# Patient Record
Sex: Male | Born: 1955 | Race: White | Hispanic: No | Marital: Married | Smoking: Never smoker
Health system: Southern US, Community
[De-identification: ages and names within clinical notes are randomized; demographics above are authoritative.]

## PROBLEM LIST (undated history)

## (undated) DIAGNOSIS — Z86018 Personal history of other benign neoplasm: Secondary | ICD-10-CM

## (undated) DIAGNOSIS — Z8371 Family history of colonic polyps: Secondary | ICD-10-CM

## (undated) DIAGNOSIS — M1990 Unspecified osteoarthritis, unspecified site: Secondary | ICD-10-CM

## (undated) DIAGNOSIS — Z83719 Family history of colon polyps, unspecified: Secondary | ICD-10-CM

## (undated) DIAGNOSIS — N419 Inflammatory disease of prostate, unspecified: Secondary | ICD-10-CM

## (undated) DIAGNOSIS — M810 Age-related osteoporosis without current pathological fracture: Secondary | ICD-10-CM

## (undated) DIAGNOSIS — I10 Essential (primary) hypertension: Secondary | ICD-10-CM

## (undated) HISTORY — DX: Age-related osteoporosis without current pathological fracture: M81.0

## (undated) HISTORY — DX: Unspecified osteoarthritis, unspecified site: M19.90

## (undated) HISTORY — DX: Family history of colonic polyps: Z83.71

## (undated) HISTORY — DX: Essential (primary) hypertension: I10

## (undated) HISTORY — DX: Family history of colon polyps, unspecified: Z83.719

---
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
## 2004-09-21 DIAGNOSIS — Z85828 Personal history of other malignant neoplasm of skin: Secondary | ICD-10-CM

## 2004-09-21 HISTORY — DX: Personal history of other malignant neoplasm of skin: Z85.828

## 2005-07-28 ENCOUNTER — Ambulatory Visit: Payer: Self-pay | Admitting: General Surgery

## 2006-09-20 DIAGNOSIS — I10 Essential (primary) hypertension: Secondary | ICD-10-CM

## 2006-09-20 HISTORY — DX: Essential (primary) hypertension: I10

## 2007-09-06 DIAGNOSIS — Z86018 Personal history of other benign neoplasm: Secondary | ICD-10-CM

## 2007-09-06 HISTORY — DX: Personal history of other benign neoplasm: Z86.018

## 2008-06-09 ENCOUNTER — Emergency Department: Payer: Self-pay | Admitting: Emergency Medicine

## 2009-09-20 HISTORY — PX: SKIN LESION EXCISION: SHX2412

## 2010-09-10 DIAGNOSIS — Z87898 Personal history of other specified conditions: Secondary | ICD-10-CM

## 2010-09-10 HISTORY — DX: Personal history of other specified conditions: Z87.898

## 2010-09-11 ENCOUNTER — Ambulatory Visit: Payer: Self-pay | Admitting: General Surgery

## 2010-09-11 HISTORY — PX: COLONOSCOPY: SHX174

## 2013-03-30 ENCOUNTER — Encounter: Payer: Self-pay | Admitting: *Deleted

## 2013-08-23 ENCOUNTER — Emergency Department: Payer: Self-pay | Admitting: Internal Medicine

## 2013-08-26 ENCOUNTER — Emergency Department: Payer: Self-pay | Admitting: Emergency Medicine

## 2013-08-30 ENCOUNTER — Emergency Department: Payer: Self-pay | Admitting: Emergency Medicine

## 2013-09-06 ENCOUNTER — Emergency Department: Payer: Self-pay | Admitting: Emergency Medicine

## 2013-09-06 LAB — BASIC METABOLIC PANEL WITH GFR
Anion Gap: 7
BUN: 16 mg/dL
Calcium, Total: 9 mg/dL
Chloride: 104 mmol/L
Co2: 28 mmol/L
Creatinine: 0.95 mg/dL
EGFR (African American): >60
EGFR (Non-African Amer.): >60
Glucose: 95 mg/dL
Osmolality: 279
Potassium: 3.2 mmol/L — ABNORMAL LOW
Sodium: 139 mmol/L

## 2013-09-06 LAB — CBC
MCHC: 33 g/dL (ref 32.0–36.0)
MCV: 100 fL (ref 80–100)
Platelet: 201 10*3/uL (ref 150–440)
RBC: 4.42 10*6/uL (ref 4.40–5.90)
RDW: 13.6 % (ref 11.5–14.5)
WBC: 8.1 10*3/uL (ref 3.8–10.6)

## 2013-09-07 ENCOUNTER — Observation Stay: Payer: Self-pay | Admitting: Internal Medicine

## 2013-09-07 LAB — CK TOTAL AND CKMB (NOT AT ARMC)
CK, Total: 68 U/L (ref 35–232)
CK, Total: 54 U/L (ref 35–232)
CK-MB: 1.3 ng/mL (ref 0.5–3.6)

## 2013-09-07 LAB — TROPONIN I: Troponin-I: 0.04 ng/mL

## 2013-09-08 DIAGNOSIS — R079 Chest pain, unspecified: Secondary | ICD-10-CM

## 2014-02-19 DIAGNOSIS — R0789 Other chest pain: Secondary | ICD-10-CM | POA: Insufficient documentation

## 2014-08-21 DIAGNOSIS — I10 Essential (primary) hypertension: Secondary | ICD-10-CM | POA: Insufficient documentation

## 2014-08-21 DIAGNOSIS — R011 Cardiac murmur, unspecified: Secondary | ICD-10-CM | POA: Insufficient documentation

## 2015-01-10 NOTE — Discharge Summary (Signed)
PATIENT NAME:  Marcus Malone, Marcus Malone MR#:  [id number] DATE OF BIRTH:  1956-03-08  DATE OF ADMISSION:  09/07/2013 DATE OF DISCHARGE:  09/07/2013  PRIMARY CARE PHYSICIAN:  Dr. Benita Stabile.  FINAL DIAGNOSES: 1.  Chest pain, not cardiac, could be musculoskeletal pain.  2.  Hypertension.  3.  Rheumatoid arthritis.  4.  Hypokalemia.   MEDICATIONS ON DISCHARGE:  Include amitriptyline 50 mg at bedtime, folic acid 1 mg 2 tablets daily, methotrexate 2.5 mg 10 orally per week, fish oil 1000 mg 2 capsules twice a day, prostate SR 320 mg with sitosterols oral capsule 2 capsules once a day, Centrum Silver 1 tablet daily, vitamin C 500 mg 2 tablets twice a day, red yeast rice 600 mg 2 capsules daily, diltiazem ER 180 mg 2 capsules daily, famciclovir 500 mg 1 tablet daily, aspirin 325 mg daily, prednisone 5 mg 5 tablets day one, 4 tablets day two, and 3 tablets day four and five, 2 tablets day six and seven, 1 tablet day eight and nine.    DIET:  Low sodium diet, regular consistency.   ACTIVITY:  As tolerated.   FOLLOW-UP:  With your rheumatologist, 1 to 2 weeks with Dr. Benita Stabile.   HOSPITAL COURSE:  The patient was admitted and discharged same day 09/07/2013.  Came in with chest pain for the past week.  Laboratory and radiological data during the hospital course included white blood cell count 8.1, hemoglobin and hematocrit 14.5 and 44.0, platelet count of 201, glucose 95, BUN 16, creatinine 0.95, sodium 139, potassium 3.2, chloride 104, CO2 of 28.  Chest x-ray negative.  Cardiac enzymes were negative x 2 more times.  Repeat potassium 3.6.  Stress test negative.  The patient was seen after the stress test, describes the pain happening while he is at his desk or watching TV, does not happen when he runs.  No relation to food.  He recently stopped his meloxicam and went up on his methotrexate for rheumatoid arthritis.  Slight pain to palpation over the left parasternal area.    HOSPITAL COURSE PER PROBLEM LIST:   1.  For the patient's chest pain, this is noncardiac.  Cardiac enzymes were negative.  Stress test was negative.  I believe this is musculoskeletal in nature with his rheumatoid arthritis.  I did give him a stat dose of prednisone and a prednisone taper and asked him to follow up with his rheumatologist.  The patient also was very anxious.  Anxiety could be playing a role here.  2.  Hypertension.  Blood pressure borderline with mood.  Continue his diltiazem.  3.  Rheumatoid arthritis.  I did give him a prednisone taper.  Continue his methotrexate.  4.  Hypokalemia.  This had been replaced during the hospital stay.  No replacement necessary upon discharge.   TIME SPENT ON DISCHARGE:  40 minutes.    ____________________________ Tana Conch. Leslye Peer, MD rjw:ea D: 09/07/2013 17:28:24 ET T: 09/08/2013 03:22:41 ET JOB#: [id number]  cc: Tana Conch. Leslye Peer, MD, <Dictator> Leona Carry. Hall Busing, MD Marisue Brooklyn MD ELECTRONICALLY SIGNED 09/17/2013 15:40

## 2015-01-10 NOTE — H&P (Signed)
PATIENT NAME:  Marcus Malone, Marcus Malone MR#:  [id number] DATE OF BIRTH:  [birth year]  DATE OF ADMISSION:  09/06/2013  CHIEF COMPLAINT: Chest pain.  HISTORY OF PRESENT ILLNESS:  A 59 year old Caucasian gentleman with past medical history of rheumatoid arthritis, gastroesophageal reflux disease and hypertension presenting with chest pain. Described his chest pain over his left chest lasting one week in total which is intermittent, though gradual onset, lasting approximately one hour each episode. He has had one episode daily for one week with no association with exertion, chest pain described as pressure like 6 to 7 out of 10 in intensity, nonradiating. No worsening or relieving factors. No association with exertion. He is actually rather healthy, running approximately 4 miles daily without any problems. His chest pain occurs only while he is at rest. He has seen cardiology previously for what sounds like a vasovagal episode and has had a normal stress test about two years ago.  Currently, he is without complaints.    REVIEW OF SYSTEMS:  CONSTITUTIONAL: Denies fever, fatigue, weakness.  EYES: Denies blurred, double vision, eye pain.  EARS, NOSE, THROAT: Denies tinnitus, ear pain, hearing loss.  RESPIRATORY: Denies cough, wheeze, shortness of breath.  CARDIOVASCULAR: Positive for chest pain. Denies any orthopnea, edema, palpitations.  GASTROINTESTINAL: Denies nausea, vomiting, diarrhea.  GENITOURINARY: Denies dysuria, hematuria.  ENDOCRINE: Denies nocturia or thyroid problems.  HEMATOLOGIC/LYMPHATIC: Denies easy bruising or bleeding.  SKIN: Denies rash or lesions.  MUSCULOSKELETAL: Positive for joint pain which is chronic. Denies any current neck, back, shoulder, knee or hip pain.  NEUROLOGIC: Denies paralysis, paresthesias.  PSYCHIATRIC: Denies anxiety or depressive symptoms.  Otherwise, full review of systems performed by me is negative.   PAST MEDICAL HISTORY: Rheumatoid arthritis, hypertension,  gastroesophageal reflux disease.   SOCIAL HISTORY: Denies any tobacco use. Occasional alcohol use; one beer weekly. Denies any drug usage. He is a professor at [employer].   FAMILY HISTORY: Positive for premature coronary disease in his father, having his first heart attack in his early 61s.   ALLERGIES:  NEOSPORIN AND SEAFOOD.   HOME MEDICATIONS: Include:  1.  Amitriptyline 50 mg p.o. at bedtime. 2.  Aspirin 325 mg p.o. daily. 3.  Centrum Silver multivitamin 1 tab p.o. daily. 4.  Diltiazem extended release 180 mg 2 capsules daily.  5.  Acyclovir 500 mg p.o. daily.  8.  Fish oil 1000 mg capsules 2 capsules b.i.d.  9.  Folic acid 1 mg 2 tablets daily. 9.  Meloxicam 15 mg by mouth as needed for pain. 10.  Methotrexate 2.5 mg 10 tablets once weekly. 11.  Prostate SR 320 mg 2 capsules p.o. daily. 12.  Red yeast rice 600 mg 2 capsules p.o. daily. 13.  Vitamin C 500 mg 2 tablets p.o. b.i.d.   PHYSICAL EXAMINATION: VITAL SIGNS: Temperature 97.8, heart rate 79, respirations 16, blood pressure 161/92, saturating 99% on room air. Weight 64.9 kg, BMI 18.9.  GENERAL: :Well-developed Caucasian gentleman, who is currently in no distress.  HEAD: Normocephalic, atraumatic.  EYES: Pupils equal, round, reactive to light. Extraocular muscles intact. No scleral icterus.  MOUTH: Moist mucous membranes. Dentition intact. No abscess noted.  EARS, NOSE AND THROAT: Throat clear without exudates. No external lesions.  NECK: Supple. No thyromegaly. No nodules. No JVD.  PULMONARY: Clear to auscultation throughout. No wheezes or rhonchi. No use of accessory muscles. Good respiratory effort.  CHEST: Nontender to palpation.  CARDIOVASCULAR: S1, S2, regular rate and rhythm. No murmurs, rubs or gallops. No edema. Pedal pulses 2+ bilaterally.  GASTROINTESTINAL: Soft, nontender, nondistended. No masses. Positive bowel sounds. No hepatosplenomegaly.   MUSCULOSKELETAL: No swelling, clubbing or edema. Range of motion full in  all extremities.  NEUROLOGIC: Cranial nerves II through XII intact. No gross motor deficits. Sensation intact. Reflexes intact.  SKIN: No ulcerations, lesions, rash or cyanosis. Skin warm, dry. Turgor is intact. PSYCHIATRIC: Mood and affect within normal limits. The patient is awake, alert and oriented x 3. Insight and judgment intact.   LABORATORY DATA: Sodium 139, potassium 3.2, chloride 104, bicarb 28, BUN 16, creatinine 0.95 glucose 95. Troponin I 0.03. WBC 8.1, hemoglobin 14.5, platelets 201. EKG performed revealing normal sinus rhythm without ST or T wave abnormalities; no EKG to compare to.   ASSESSMENT AND PLAN: A 59 year old Caucasian gentleman with history of rheumatoid arthritis and gastroesophageal reflux disease as well as hypertension presenting with chest pain.  1.  Atypical chest pain. As far as risk factors, does have hypertension, rheumatoid arthritis and family history. We will admit to observation with cardiac telemetry. Consult cardiology as he is well known to Dr. Ubaldo Glassing. He has been given aspirin thus far.   2.  Hypertension. Continue with Cardizem. 3.  Rheumatoid arthritis. Continue weekly methotrexate.  4.  Hypokalemia. Replace to goal of potassium 4 to 5.  5.  Venous thrombosis prophylaxis with heparin subcutaneous.   CODE STATUS: The patient is full code.   TIME SPENT: 45 minutes.     ____________________________ Aaron Mose. Hower, MD dkh:NTS D: 09/07/2013 01:18:54 ET T: 09/07/2013 01:36:05 ET JOB#: [id number]  cc: Aaron Mose. Hower, MD, <Dictator> DAVID Woodfin Ganja MD ELECTRONICALLY SIGNED 09/08/2013 2:45

## 2015-06-25 ENCOUNTER — Ambulatory Visit: Payer: Self-pay | Admitting: General Surgery

## 2015-07-21 ENCOUNTER — Ambulatory Visit: Payer: Self-pay | Admitting: General Surgery

## 2015-10-13 ENCOUNTER — Encounter: Payer: Self-pay | Admitting: General Surgery

## 2015-10-22 ENCOUNTER — Encounter: Payer: Self-pay | Admitting: *Deleted

## 2016-01-08 ENCOUNTER — Encounter: Payer: Self-pay | Admitting: General Surgery

## 2016-01-12 ENCOUNTER — Encounter: Payer: Self-pay | Admitting: General Surgery

## 2016-01-12 ENCOUNTER — Ambulatory Visit (INDEPENDENT_AMBULATORY_CARE_PROVIDER_SITE_OTHER): Payer: BLUE CROSS/BLUE SHIELD | Admitting: General Surgery

## 2016-01-12 VITALS — BP 128/68 | HR 70 | Resp 12 | Ht 73.0 in | Wt 159.0 lb

## 2016-01-12 DIAGNOSIS — Z800 Family history of malignant neoplasm of digestive organs: Secondary | ICD-10-CM

## 2016-01-12 MED ORDER — POLYETHYLENE GLYCOL 3350 17 GM/SCOOP PO POWD: ORAL | Status: DC

## 2016-01-12 NOTE — H&P (Signed)
HPI Marcus Malone is a 60 y.o. male Here today for a evaluation of a colonoscopy. Last colonoscopy 09/11/2010. No GI problems at this time. Bowel movements can go every 2-3 days.  His last colonoscopy in 2011 was compromised by poor prep in spite of his making use of the entire 2 L of MiraLAX solution.  I personally reviewed the patient's history.  HPI  Past Medical History  Diagnosis Date  . Hypertension 2008  . Family history of colonic polyps   . Arthritis     Rheumatiod  . Osteoporosis     Past Surgical History  Procedure Laterality Date  . Skin lesion excision  2011  . Colonoscopy  09/11/2010    Family History  Problem Relation Age of Onset  . Colon polyps Mother   . Colon polyps Maternal Uncle   . Heart disease Father   . Heart disease Mother     Social History Social History  Substance Use Topics  . Smoking status: Never Smoker   . Smokeless tobacco: Never Used  . Alcohol Use: Yes    Allergies  Allergen Reactions  . Shellfish Allergy     Stomach pain   . Neosporin [Neomycin-Bacitracin Zn-Polymyx] Rash    Current Outpatient Prescriptions  Medication Sig Dispense Refill  . acyclovir (ZOVIRAX) 200 MG capsule Take 200 mg by mouth as needed.    Marland Kitchen amitriptyline (ELAVIL) 50 MG tablet Take 50 mg by mouth 3 (three) times a week.  0  . Ascorbic Acid (VITAMIN Malone) 1000 MG tablet Take 1,000 mg by mouth daily.    Marland Kitchen aspirin 325 MG tablet Take 325 mg by mouth daily.    . Cholecalciferol (VITAMIN D) 2000 units CAPS Take by mouth.    . diltiazem (CARDIZEM CD) 180 MG 24 hr capsule Take 360 mg by mouth daily.  0  . folic acid (FOLVITE) 1 MG tablet Take 2 mg by mouth daily.  0  . meloxicam (MOBIC) 15 MG tablet Take 15 mg by mouth daily.  0  . methotrexate (RHEUMATREX) 2.5 MG tablet   0  . Multiple Vitamins-Minerals (CENTRUM SILVER PO) Take 1 tablet by mouth daily.    . Omega-3 Fatty Acids (FISH OIL PO) Take by mouth.    . potassium gluconate 595 (99 K) MG TABS tablet  Take 595 mg by mouth.    . Red Yeast Rice Extract (RED YEAST RICE PO) Take by mouth.    Marcus Malone (PROSTATE SR PO) Take by mouth.    . triamcinolone cream (KENALOG) 0.1 % Apply 1 application topically 2 (two) times daily.    . polyethylene glycol powder (GLYCOLAX/MIRALAX) powder 255 grams one bottle for colonoscopy prep 255 g 0   No current facility-administered medications for this visit.    Review of Systems Review of Systems  Constitutional: Negative.   Respiratory: Negative.   Cardiovascular: Negative.   Gastrointestinal: Negative.     Blood pressure 128/68, pulse 70, resp. rate 12, height 6\' 1"  (1.854 m), weight 159 lb (72.122 kg).  Physical Exam Physical Exam  Constitutional: He is oriented to person, place, and time. He appears well-developed and well-nourished.  Eyes: Conjunctivae are normal. No scleral icterus.  Neck: Neck supple.  Cardiovascular: Normal rate and regular rhythm.   Murmur heard.  Systolic murmur is present with a grade of 2/6  Pulmonary/Chest: Effort normal and breath sounds normal.  Lymphadenopathy:    He has no cervical adenopathy.  Neurological: He is alert and oriented to person,  place, and time.  Skin: Skin is warm and dry.    Data Reviewed 09/11/2010 colonoscopy reviewed. 2 day prep recommended.  Assessment    Candidate for follow-up colonoscopy.    Plan        Colonoscopy with possible biopsy/polypectomy prn: Information regarding the procedure, including its potential risks and complications (including but not limited to perforation of the bowel, which may require emergency surgery to repair, and bleeding) was verbally given to the patient. Educational information regarding lower intestinal endoscopy was given to the patient. Written instructions for how to complete the bowel prep using Miralax were provided. The importance of drinking ample fluids to avoid dehydration as a result of the prep emphasized.   This  patient has been asked to complete a clear liquid diet 2 days prior to colonoscopy. He will make use of citrate magensium 10 oz. Pre-day 2. No change in patient's Methotrexate. Patient to decrease from a 325 mg aspirin to an 81 mg aspirin one week before procedure. Also, discontinue fish oil for one week prior to colonoscopy.   The patient will continue his methotrexate  PCP:  Marcus Malone This information has been scribed by Marcus Malone CMA.    Marcus Malone 01/12/2016, 9:30 PM

## 2016-01-12 NOTE — Patient Instructions (Addendum)
Colonoscopy A colonoscopy is an exam to look at the entire large intestine (colon). This exam can help find problems such as tumors, polyps, inflammation, and areas of bleeding. The exam takes about 1 hour.  LET [facility] CARE PROVIDER KNOW ABOUT:   Any allergies you have.  All medicines you are taking, including vitamins, herbs, eye drops, creams, and over-the-counter medicines.  Previous problems you or members of your family have had with the use of anesthetics.  Any blood disorders you have.  Previous surgeries you have had.  Medical conditions you have. RISKS AND COMPLICATIONS  Generally, this is a safe procedure. However, as with any procedure, complications can occur. Possible complications include:  Bleeding.  Tearing or rupture of the colon wall.  Reaction to medicines given during the exam.  Infection (rare). BEFORE THE PROCEDURE   Ask your health care provider about changing or stopping your regular medicines.  You may be prescribed an oral bowel prep. This involves drinking a large amount of medicated liquid, starting the day before your procedure. The liquid will cause you to have multiple loose stools until your stool is almost clear or light green. This cleans out your colon in preparation for the procedure.  Do not eat or drink anything else once you have started the bowel prep, unless your health care provider tells you it is safe to do so.  Arrange for someone to drive you home after the procedure. PROCEDURE   You will be given medicine to help you relax (sedative).  You will lie on your side with your knees bent.  A long, flexible tube with a light and camera on the end (colonoscope) will be inserted through the rectum and into the colon. The camera sends video back to a computer screen as it moves through the colon. The colonoscope also releases carbon dioxide gas to inflate the colon. This helps your health care provider see the area better.  During  the exam, your health care provider may take a small tissue sample (biopsy) to be examined under a microscope if any abnormalities are found.  The exam is finished when the entire colon has been viewed. AFTER THE PROCEDURE   Do not drive for 24 hours after the exam.  You may have a small amount of blood in your stool.  You may pass moderate amounts of gas and have mild abdominal cramping or bloating. This is caused by the gas used to inflate your colon during the exam.  Ask when your test results will be ready and how you will get your results. Make sure you get your test results.   This information is not intended to replace advice given to you by your health care provider. Make sure you discuss any questions you have with your health care provider.   Document Released: 09/03/2000 Document Revised: 06/27/2013 Document Reviewed: 05/14/2013 Elsevier Interactive Patient Education [employer].  This patient has been asked to complete a clear liquid diet 2 days prior to colonoscopy. He will make use of citrate magensium 10 oz. Pre-day 2. No change in patient's Methotrexate. Patient to decrease from a 325 mg aspirin to an 81 mg aspirin one week before procedure. Also, discontinue fish oil for one week prior to colonoscopy.

## 2016-01-12 NOTE — Progress Notes (Signed)
Patient ID: Marcus Malone, male   DOB: 04-19-1956, 60 y.o.   MRN: [id number]  Chief Complaint  Patient presents with  . Colonoscopy    HPI Marcus Malone is a 60 y.o. male Here today for a evaluation of a colonoscopy. Last colonoscopy 09/11/2010. No GI problems at this time. Bowel movements can go every 2-3 days.  His last colonoscopy in 2011 was compromised by poor prep in spite of his making use of the entire 2 L of MiraLAX solution.  I personally reviewed the patient's history.  HPI  Past Medical History  Diagnosis Date  . Hypertension 2008  . Family history of colonic polyps   . Arthritis     Rheumatiod  . Osteoporosis     Past Surgical History  Procedure Laterality Date  . Skin lesion excision  2011  . Colonoscopy  09/11/2010    Family History  Problem Relation Age of Onset  . Colon polyps Mother   . Colon polyps Maternal Uncle   . Heart disease Father   . Heart disease Mother     Social History Social History  Substance Use Topics  . Smoking status: Never Smoker   . Smokeless tobacco: Never Used  . Alcohol Use: Yes    Allergies  Allergen Reactions  . Shellfish Allergy     Stomach pain   . Neosporin [Neomycin-Bacitracin Zn-Polymyx] Rash    Current Outpatient Prescriptions  Medication Sig Dispense Refill  . acyclovir (ZOVIRAX) 200 MG capsule Take 200 mg by mouth as needed.    Marland Kitchen amitriptyline (ELAVIL) 50 MG tablet Take 50 mg by mouth 3 (three) times a week.  0  . Ascorbic Acid (VITAMIN C) 1000 MG tablet Take 1,000 mg by mouth daily.    Marland Kitchen aspirin 325 MG tablet Take 325 mg by mouth daily.    . Cholecalciferol (VITAMIN D) 2000 units CAPS Take by mouth.    . diltiazem (CARDIZEM CD) 180 MG 24 hr capsule Take 360 mg by mouth daily.  0  . folic acid (FOLVITE) 1 MG tablet Take 2 mg by mouth daily.  0  . meloxicam (MOBIC) 15 MG tablet Take 15 mg by mouth daily.  0  . methotrexate (RHEUMATREX) 2.5 MG tablet   0  . Multiple Vitamins-Minerals (CENTRUM  SILVER PO) Take 1 tablet by mouth daily.    . Omega-3 Fatty Acids (FISH OIL PO) Take by mouth.    . potassium gluconate 595 (99 K) MG TABS tablet Take 595 mg by mouth.    . Red Yeast Rice Extract (RED YEAST RICE PO) Take by mouth.    [name] (PROSTATE SR PO) Take by mouth.    . triamcinolone cream (KENALOG) 0.1 % Apply 1 application topically 2 (two) times daily.    . polyethylene glycol powder (GLYCOLAX/MIRALAX) powder 255 grams one bottle for colonoscopy prep 255 g 0   No current facility-administered medications for this visit.    Review of Systems Review of Systems  Constitutional: Negative.   Respiratory: Negative.   Cardiovascular: Negative.   Gastrointestinal: Negative.     Blood pressure 128/68, pulse 70, resp. rate 12, height 6\' 1"  (1.854 m), weight 159 lb (72.122 kg).  Physical Exam Physical Exam  Constitutional: He is oriented to person, place, and time. He appears well-developed and well-nourished.  Eyes: Conjunctivae are normal. No scleral icterus.  Neck: Neck supple.  Cardiovascular: Normal rate and regular rhythm.   Murmur heard.  Systolic murmur is present with a grade  of 2/6  Pulmonary/Chest: Effort normal and breath sounds normal.  Lymphadenopathy:    He has no cervical adenopathy.  Neurological: He is alert and oriented to person, place, and time.  Skin: Skin is warm and dry.    Data Reviewed 09/11/2010 colonoscopy reviewed. 2 day prep recommended.  Assessment    Candidate for follow-up colonoscopy.    Plan        Colonoscopy with possible biopsy/polypectomy prn: Information regarding the procedure, including its potential risks and complications (including but not limited to perforation of the bowel, which may require emergency surgery to repair, and bleeding) was verbally given to the patient. Educational information regarding lower intestinal endoscopy was given to the patient. Written instructions for how to complete the bowel  prep using Miralax were provided. The importance of drinking ample fluids to avoid dehydration as a result of the prep emphasized.   This patient has been asked to complete a clear liquid diet 2 days prior to colonoscopy. He will make use of citrate magensium 10 oz. Pre-day 2. No change in patient's Methotrexate. Patient to decrease from a 325 mg aspirin to an 81 mg aspirin one week before procedure. Also, discontinue fish oil for one week prior to colonoscopy.   The patient will continue his methotrexate  PCP:  Benita Stabile C This information has been scribed by Gaspar Cola CMA.    [name] 01/12/2016, 9:30 PM

## 2016-01-14 ENCOUNTER — Encounter: Payer: Self-pay | Admitting: *Deleted

## 2016-01-14 NOTE — Progress Notes (Signed)
Patient ID: Marcus Malone, male   DOB: [birth year], 60 y.o.   MRN: [id number]  Patient's colonoscopy has been scheduled for 02-04-16 at [facility].   This has been ok'd by [employer] in Endoscopy. Trish notified.   Patient aware.

## 2016-02-04 ENCOUNTER — Ambulatory Visit: Payer: BLUE CROSS/BLUE SHIELD | Admitting: Anesthesiology

## 2016-02-04 ENCOUNTER — Encounter
Admission: RE | Disposition: A | Discharge status: Home / Self Care | Payer: Self-pay | Source: Ambulatory Visit | Attending: General Surgery

## 2016-02-04 ENCOUNTER — Encounter: Payer: Self-pay | Admitting: Anesthesiology

## 2016-02-04 ENCOUNTER — Ambulatory Visit
Admission: RE | Admit: 2016-02-04 | Discharge: 2016-02-04 | Disposition: A | Discharge status: Home / Self Care | Payer: BLUE CROSS/BLUE SHIELD | Source: Ambulatory Visit | Attending: General Surgery | Admitting: General Surgery

## 2016-02-04 DIAGNOSIS — Z800 Family history of malignant neoplasm of digestive organs: Secondary | ICD-10-CM | POA: Diagnosis not present

## 2016-02-04 DIAGNOSIS — I10 Essential (primary) hypertension: Secondary | ICD-10-CM | POA: Insufficient documentation

## 2016-02-04 DIAGNOSIS — M1990 Unspecified osteoarthritis, unspecified site: Secondary | ICD-10-CM | POA: Insufficient documentation

## 2016-02-04 DIAGNOSIS — Z1211 Encounter for screening for malignant neoplasm of colon: Secondary | ICD-10-CM | POA: Diagnosis not present

## 2016-02-04 DIAGNOSIS — Z79899 Other long term (current) drug therapy: Secondary | ICD-10-CM | POA: Insufficient documentation

## 2016-02-04 HISTORY — DX: Inflammatory disease of prostate, unspecified: N41.9

## 2016-02-04 HISTORY — PX: COLONOSCOPY WITH PROPOFOL: SHX5780

## 2016-02-04 SURGERY — COLONOSCOPY WITH PROPOFOL
Anesthesia: General

## 2016-02-04 MED ORDER — MIDAZOLAM HCL 5 MG/5ML IJ SOLN
INTRAMUSCULAR | Status: DC | PRN
  Administered 2016-02-04: 1 mg via INTRAVENOUS

## 2016-02-04 MED ORDER — PROPOFOL 10 MG/ML IV BOLUS
INTRAVENOUS | Status: DC | PRN
  Administered 2016-02-04: 100 mg via INTRAVENOUS

## 2016-02-04 MED ORDER — SODIUM CHLORIDE 0.9 % IV SOLN: INTRAVENOUS | Status: DC

## 2016-02-04 MED ORDER — PROPOFOL 500 MG/50ML IV EMUL
INTRAVENOUS | Status: DC | PRN
  Administered 2016-02-04: 200 ug/kg/min via INTRAVENOUS

## 2016-02-04 MED ORDER — LIDOCAINE HCL (CARDIAC) 20 MG/ML IV SOLN
INTRAVENOUS | Status: DC | PRN
  Administered 2016-02-04: 30 mg via INTRAVENOUS

## 2016-02-04 MED ORDER — FENTANYL CITRATE (PF) 100 MCG/2ML IJ SOLN
INTRAMUSCULAR | Status: DC | PRN
  Administered 2016-02-04: 50 ug via INTRAVENOUS

## 2016-02-04 MED ORDER — SODIUM CHLORIDE 0.9 % IV SOLN
INTRAVENOUS | Status: DC | PRN
  Administered 2016-02-04: 07:00:00 via INTRAVENOUS
  Administered 2016-02-04: 1000 mL

## 2016-02-04 NOTE — Anesthesia Postprocedure Evaluation (Signed)
Anesthesia Post Note  Patient: Marcus Malone  Procedure(s) Performed: Procedure(s) (LRB): COLONOSCOPY WITH PROPOFOL (N/A)  Patient location during evaluation: Endoscopy Anesthesia Type: General Level of consciousness: awake and alert Pain management: pain level controlled Vital Signs Assessment: post-procedure vital signs reviewed and stable Respiratory status: spontaneous breathing, nonlabored ventilation, respiratory function stable and patient connected to nasal cannula oxygen Cardiovascular status: blood pressure returned to baseline and stable Postop Assessment: no signs of nausea or vomiting Anesthetic complications: no    Last Vitals:  Filed Vitals:   02/04/16 0840 02/04/16 0850  BP: 118/65 123/76  Pulse: 77 71  Temp:    Resp: 15 12    Last Pain: There were no vitals filed for this visit.               Padraig,Genevieve Ritzel S

## 2016-02-04 NOTE — Anesthesia Preprocedure Evaluation (Signed)
Anesthesia Evaluation  Patient identified by MRN, date of birth, ID band Patient awake    Reviewed: Allergy & Precautions, NPO status , Patient's Chart, lab work & pertinent test results, reviewed documented beta blocker date and time   Airway Mallampati: II  TM Distance: >3 FB     Dental  (+) Chipped   Pulmonary           Cardiovascular hypertension, Pt. on medications      Neuro/Psych    GI/Hepatic   Endo/Other    Renal/GU      Musculoskeletal  (+) Arthritis ,   Abdominal   Peds  Hematology   Anesthesia Other Findings   Reproductive/Obstetrics                             Anesthesia Physical Anesthesia Plan  ASA: III  Anesthesia Plan: General   Post-op Pain Management:    Induction: Intravenous  Airway Management Planned: Nasal Cannula  Additional Equipment:   Intra-op Plan:   Post-operative Plan:   Informed Consent: I have reviewed the patients History and Physical, chart, labs and discussed the procedure including the risks, benefits and alternatives for the proposed anesthesia with the patient or authorized representative who has indicated his/her understanding and acceptance.     Plan Discussed with: CRNA  Anesthesia Plan Comments:         Anesthesia Quick Evaluation

## 2016-02-04 NOTE — Op Note (Signed)
[facility] Gastroenterology Patient Name: Marcus Malone Procedure Date: 02/04/2016 7:41 AM MRN: [id number] Account #: [id number] Date of Birth: 10/30/1955 Admit Type: Outpatient Age: 60 Room: [facility] ENDO ROOM 1 Gender: Male Note Status: Finalized Procedure:            Colonoscopy Indications:          Family history of colon cancer in a first-degree                        relative Providers:            [name], MD Referring MD:         Leona Carry. Hall Busing, MD (Referring MD) Medicines:            Monitored Anesthesia Care Complications:        No immediate complications. Procedure:            Pre-Anesthesia Assessment:                       - Prior to the procedure, a History and Physical was                        performed, and patient medications, allergies and                        sensitivities were reviewed. The patient's tolerance of                        previous anesthesia was reviewed.                       - The risks and benefits of the procedure and the                        sedation options and risks were discussed with the                        patient. All questions were answered and informed                        consent was obtained.                       After obtaining informed consent, the colonoscope was                        passed under direct vision. Throughout the procedure,                        the patient's blood pressure, pulse, and oxygen                        saturations were monitored continuously. The                        Colonoscope was introduced through the anus and                        advanced to the the cecum, identified by appendiceal  orifice and ileocecal valve. The colonoscopy was                        somewhat difficult due to significant looping.                        Successful completion of the procedure was aided by                        using manual pressure. The patient  tolerated the                        procedure well. The quality of the bowel preparation                        was good. Findings:      The entire examined colon appeared normal on direct and retroflexion       views. Impression:           - The entire examined colon is normal on direct and                        retroflexion views.                       - No specimens collected. Recommendation:       - Repeat colonoscopy in 5 years for surveillance. Procedure Code(s):    --- Professional ---                       [phone], Colonoscopy, flexible; diagnostic, including                        collection of specimen(s) by brushing or washing, when                        performed (separate procedure) Diagnosis Code(s):    --- Professional ---                       Z80.0, Family history of malignant neoplasm of                        digestive organs CPT copyright 2016 American [employer] Association. All rights reserved. The codes documented in this report are preliminary and upon coder review may  be revised to meet current compliance requirements. [name], MD 02/04/2016 8:13:59 AM This report has been signed electronically. Number of Addenda: 0 Note Initiated On: 02/04/2016 7:41 AM Scope Withdrawal Time: 0 hours 9 minutes 58 seconds  Total Procedure Duration: 0 hours 24 minutes 44 seconds       [facility]

## 2016-02-04 NOTE — Transfer of Care (Signed)
Immediate Anesthesia Transfer of Care Note  Patient: Marcus Malone  Procedure(s) Performed: Procedure(s): COLONOSCOPY WITH PROPOFOL (N/A)  Patient Location: PACU and Endoscopy Unit  Anesthesia Type:General  Level of Consciousness: sedated  Airway & Oxygen Therapy: Patient Spontanous Breathing and Patient connected to nasal cannula oxygen  Post-op Assessment: Report given to RN and Post -op Vital signs reviewed and stable  Post vital signs: Reviewed and stable  Last Vitals:  Filed Vitals:   02/04/16 0714  BP: 148/78  Pulse: 85  Temp: 36.5 C  Resp: 18    Last Pain: There were no vitals filed for this visit.       Complications: No apparent anesthesia complications

## 2016-02-04 NOTE — H&P (Signed)
No change in clinical exam or history.  For colonoscopy.

## 2016-02-05 ENCOUNTER — Encounter: Payer: Self-pay | Admitting: General Surgery

## 2018-06-15 ENCOUNTER — Ambulatory Visit: Payer: BLUE CROSS/BLUE SHIELD

## 2019-06-06 ENCOUNTER — Other Ambulatory Visit: Payer: Self-pay

## 2019-06-06 ENCOUNTER — Ambulatory Visit: Payer: BC Managed Care – PPO

## 2019-06-06 DIAGNOSIS — Z23 Encounter for immunization: Secondary | ICD-10-CM

## 2020-02-18 ENCOUNTER — Encounter: Payer: Self-pay | Admitting: Emergency Medicine

## 2020-02-18 ENCOUNTER — Emergency Department
Admission: EM | Admit: 2020-02-18 | Discharge: 2020-02-18 | Disposition: A | Discharge status: Home / Self Care | Payer: BC Managed Care – PPO | Attending: Emergency Medicine | Admitting: Emergency Medicine

## 2020-02-18 ENCOUNTER — Other Ambulatory Visit: Payer: Self-pay

## 2020-02-18 ENCOUNTER — Emergency Department: Payer: BC Managed Care – PPO

## 2020-02-18 DIAGNOSIS — R1032 Left lower quadrant pain: Secondary | ICD-10-CM | POA: Diagnosis present

## 2020-02-18 DIAGNOSIS — I10 Essential (primary) hypertension: Secondary | ICD-10-CM | POA: Diagnosis not present

## 2020-02-18 DIAGNOSIS — Z79899 Other long term (current) drug therapy: Secondary | ICD-10-CM | POA: Insufficient documentation

## 2020-02-18 DIAGNOSIS — K5900 Constipation, unspecified: Secondary | ICD-10-CM | POA: Insufficient documentation

## 2020-02-18 LAB — COMPREHENSIVE METABOLIC PANEL
ALT: 36 U/L (ref 0–44)
AST: 31 U/L (ref 15–41)
Albumin: 4.2 g/dL (ref 3.5–5.0)
Alkaline Phosphatase: 59 U/L (ref 38–126)
Anion gap: 11 (ref 5–15)
BUN: 15 mg/dL (ref 8–23)
CO2: 21 mmol/L — ABNORMAL LOW (ref 22–32)
Calcium: 8.9 mg/dL (ref 8.9–10.3)
Chloride: 106 mmol/L (ref 98–111)
Creatinine, Ser: 1.11 mg/dL (ref 0.61–1.24)
GFR calc Af Amer: >60 mL/min (ref >60)
GFR calc non Af Amer: >60 mL/min (ref >60)
Glucose, Bld: 168 mg/dL — ABNORMAL HIGH (ref 70–99)
Potassium: 3.4 mmol/L — ABNORMAL LOW (ref 3.5–5.1)
Sodium: 138 mmol/L (ref 135–145)
Total Bilirubin: 1.2 mg/dL (ref 0.3–1.2)
Total Protein: 7.2 g/dL (ref 6.5–8.1)

## 2020-02-18 LAB — URINALYSIS, COMPLETE (UACMP) WITH MICROSCOPIC
Bacteria, UA: NONE SEEN
Bilirubin Urine: NEGATIVE
Glucose, UA: NEGATIVE mg/dL
Hgb urine dipstick: NEGATIVE
Ketones, ur: NEGATIVE mg/dL
Nitrite: NEGATIVE
Protein, ur: NEGATIVE mg/dL
Specific Gravity, Urine: 1.021 (ref 1.005–1.030)
pH: 5 (ref 5.0–8.0)

## 2020-02-18 LAB — CBC
HCT: 43.9 % (ref 39.0–52.0)
Hemoglobin: 15 g/dL (ref 13.0–17.0)
MCH: 33 pg (ref 26.0–34.0)
MCHC: 34.2 g/dL (ref 30.0–36.0)
MCV: 96.5 fL (ref 80.0–100.0)
Platelets: 257 10*3/uL (ref 150–400)
RBC: 4.55 MIL/uL (ref 4.22–5.81)
RDW: 12.3 % (ref 11.5–15.5)
WBC: 7.2 10*3/uL (ref 4.0–10.5)
nRBC: 0 % (ref 0.0–0.2)

## 2020-02-18 LAB — LIPASE, BLOOD: Lipase: 29 U/L (ref 11–51)

## 2020-02-18 MED ORDER — POLYETHYLENE GLYCOL 3350 17 GM/SCOOP PO POWD: 17.0000 g | Freq: Two times a day (BID) | ORAL | 0 refills | Status: DC | PRN

## 2020-02-18 MED ORDER — IOHEXOL 9 MG/ML PO SOLN
500.0000 mL | ORAL | Status: AC
  Administered 2020-02-18 (×2): 500 mL via ORAL

## 2020-02-18 MED ORDER — SODIUM CHLORIDE 0.9% FLUSH
3.0000 mL | Freq: Once | INTRAVENOUS | Status: AC
  Administered 2020-02-18: 3 mL via INTRAVENOUS

## 2020-02-18 MED ORDER — IOHEXOL 300 MG/ML  SOLN
100.0000 mL | Freq: Once | INTRAMUSCULAR | Status: AC | PRN
  Administered 2020-02-18: 100 mL via INTRAVENOUS

## 2020-02-18 MED ORDER — DOCUSATE SODIUM 100 MG PO CAPS: 100.0000 mg | ORAL_CAPSULE | Freq: Two times a day (BID) | ORAL | 0 refills | Status: AC

## 2020-02-18 NOTE — ED Triage Notes (Addendum)
Pt c/o LLQ pain with nausea for the past 2 weeks was seen by PCP on Thursday and prescribed cipro and flagyl, states the pain has worsened over the weekend and PCP referred pt to the ED for further testing and treatment.

## 2020-02-18 NOTE — ED Provider Notes (Signed)
[facility] Emergency Department Provider Note  Time seen: 10:51 AM  I have reviewed the triage vital signs and the nursing notes.   HISTORY  Chief Complaint Abdominal Pain   HPI Marcus Malone is a 64 y.o. male with a past medical history of arthritis, hypertension, presents to the emergency department for left lower quadrant abdominal pain.  According to the patient for the past 10 days to 2 weeks he has been experiencing intermittent left lower quadrant pain, at times severe.  Saw his doctor on Thursday was prescribed ciprofloxacin and Flagyl which she has been taking over the past several days without improvement.  States nausea at times but denies any vomiting.  Does state he has noticed smaller bowel movements recently but denies constipation.  Denies dysuria or hematuria.  Has a history of kidney stones but states this feels very different.  Past Medical History:  Diagnosis Date  . Arthritis    Rheumatiod  . Family history of colonic polyps   . Hx of basal cell carcinoma 09/21/2004   L superior lateral deltoid  . Hx of dysplastic nevus    multiple sites  . Hypertension 2008  . Osteoporosis   . Prostatitis     Patient Active Problem List   Diagnosis Date Noted  . Family history of colon cancer 01/12/2016    Past Surgical History:  Procedure Laterality Date  . COLONOSCOPY  09/11/2010  . COLONOSCOPY WITH PROPOFOL N/A 02/04/2016   Procedure: COLONOSCOPY WITH PROPOFOL;  Surgeon: [name], MD;  Location: [facility] ENDOSCOPY;  Service: Endoscopy;  Laterality: N/A;  . SKIN LESION EXCISION  2011    Prior to Admission medications   Medication Sig Start Date End Date Taking? Authorizing Provider  acyclovir (ZOVIRAX) 200 MG capsule Take 200 mg by mouth as needed.    [provider]  amitriptyline (ELAVIL) 50 MG tablet Take 50 mg by mouth 3 (three) times a week. 11/17/15   [provider]  Ascorbic Acid (VITAMIN C) 1000 MG tablet  Take 1,000 mg by mouth daily.    [provider]  aspirin 325 MG tablet Take 325 mg by mouth daily.    [provider]  Cholecalciferol (VITAMIN D) 2000 units CAPS Take by mouth.    [provider]  diltiazem (CARDIZEM CD) 180 MG 24 hr capsule Take 360 mg by mouth daily. 12/15/15   [provider]  folic acid (FOLVITE) 1 MG tablet Take 2 mg by mouth daily. 12/16/15   [provider]  meloxicam (MOBIC) 15 MG tablet Take 15 mg by mouth daily. 10/16/15   [provider]  methotrexate (RHEUMATREX) 2.5 MG tablet  12/15/15   [provider]  Multiple Vitamins-Minerals (CENTRUM SILVER PO) Take 1 tablet by mouth daily.    [provider]  Omega-3 Fatty Acids (FISH OIL PO) Take by mouth.    [provider]  potassium gluconate 595 (99 K) MG TABS tablet Take 595 mg by mouth.    [provider]  Red Yeast Rice Extract (RED YEAST RICE PO) Take by mouth.    [provider]  Saw Palmetto-Phytosterols (PROSTATE SR PO) Take by mouth.    [provider]  triamcinolone cream (KENALOG) 0.1 % Apply 1 application topically 2 (two) times daily.    [provider]    Allergies  Allergen Reactions  . Adhesive [Tape]   . Polysporin [Bacitracin-Polymyxin B]   . Shellfish Allergy     Stomach pain   .  Neosporin [Neomycin-Bacitracin Zn-Polymyx] Rash    Family History  Problem Relation Age of Onset  . Colon polyps Mother   . Heart disease Mother   . Colon polyps Maternal Uncle   . Heart disease Father     Social History Social History   Tobacco Use  . Smoking status: Never Smoker  . Smokeless tobacco: Never Used  Substance Use Topics  . Alcohol use: Yes  . Drug use: No    Review of Systems Constitutional: Negative for fever. Cardiovascular: Negative for chest pain. Respiratory: Negative for shortness of breath. Gastrointestinal: Left lower quadrant abdominal pain. Genitourinary:  Negative for urinary compaints Musculoskeletal: Negative for musculoskeletal complaints Neurological: Negative for headache All other ROS negative  ____________________________________________   PHYSICAL EXAM:  VITAL SIGNS: ED Triage Vitals  Enc Vitals Group     BP 02/18/20 0954 137/71     Pulse Rate 02/18/20 0954 82     Resp 02/18/20 0954 16     Temp 02/18/20 0954 98.3 F (36.8 C)     Temp Source 02/18/20 0954 Oral     SpO2 02/18/20 0954 97 %     Weight 02/18/20 0952 158 lb (71.7 kg)     Height 02/18/20 0952 6\' 1"  (1.854 m)     Head Circumference --      Peak Flow --      Pain Score 02/18/20 0951 2     Pain Loc --      Pain Edu? --      Excl. in [place]? --    Constitutional: Alert and oriented. Well appearing and in no distress. Eyes: Normal exam ENT      Head: Normocephalic and atraumatic.      Mouth/Throat: Mucous membranes are moist. Cardiovascular: Normal rate, regular rhythm.  Respiratory: Normal respiratory effort without tachypnea nor retractions. Breath sounds are clear  Gastrointestinal: Soft, mild left lower quadrant tenderness palpation without rebound guarding or distention. Musculoskeletal: Nontender with normal range of motion in all extremities. Neurologic:  Normal speech and language. No gross focal neurologic deficits Skin:  Skin is warm, dry and intact.  Psychiatric: Mood and affect are normal.   ____________________________________________   RADIOLOGY  CT consistent with significant constipation.  ____________________________________________   INITIAL IMPRESSION / ASSESSMENT AND PLAN / ED COURSE  Pertinent labs & imaging results that were available during my care of the patient were reviewed by me and considered in my medical decision making (see chart for details).   Patient presents emergency department for left lower quadrant abdominal pain over the past 2 weeks or so worse over the past few days.  Currently taking ciprofloxacin and Flagyl  for the last 2 to 3 days without improvement.  Differential would include diverticulitis, colitis, perforation, abscess, hernia, UTI or pyelonephritis, ureterolithiasis.  Patient's lab work is overall reassuring lab work largely negative.  We will obtain CT imaging the abdomen/pelvis to further evaluate.  Patient agreeable to plan of care.  CT scan consistent with significant constipation.  We will start the patient on MiraLAX and Colace regimen.  We will have the patient follow-up with his doctor.  Urinalysis did show several white cells but no bacteria have sent a urine culture as a precaution.  RAVAUGHN BUSSCHER was evaluated in Emergency Department on 02/18/2020 for the symptoms described in the history of present illness. He was evaluated in the context of the global COVID-19 pandemic, which necessitated consideration that the patient might be at risk for infection with the SARS-CoV-2 virus  that causes COVID-19. Institutional protocols and algorithms that pertain to the evaluation of patients at risk for COVID-19 are in a state of rapid change based on information released by regulatory bodies including the CDC and federal and state organizations. These policies and algorithms were followed during the patient's care in the ED.  ____________________________________________   FINAL CLINICAL IMPRESSION(S) / ED DIAGNOSES  Left lower quadrant abdominal pain Constipation   Harvest Dark, MD 02/18/20 1248

## 2020-02-18 NOTE — ED Notes (Signed)
Patient reports pain to left groin area that slowing radiated into abd area and this week has been severe. Hx of diverticulitis. Awaiting md eval and plan of care.

## 2020-02-18 NOTE — ED Notes (Signed)
Pt finished oral contrast. CT called.

## 2020-02-19 LAB — URINE CULTURE: Culture: NO GROWTH

## 2020-03-06 ENCOUNTER — Encounter: Payer: Self-pay | Admitting: Dermatology

## 2020-03-06 ENCOUNTER — Ambulatory Visit: Payer: BC Managed Care – PPO | Admitting: Dermatology

## 2020-03-06 ENCOUNTER — Other Ambulatory Visit: Payer: Self-pay

## 2020-03-06 DIAGNOSIS — Z86018 Personal history of other benign neoplasm: Secondary | ICD-10-CM

## 2020-03-06 DIAGNOSIS — D485 Neoplasm of uncertain behavior of skin: Secondary | ICD-10-CM

## 2020-03-06 DIAGNOSIS — D225 Melanocytic nevi of trunk: Secondary | ICD-10-CM

## 2020-03-06 DIAGNOSIS — D229 Melanocytic nevi, unspecified: Secondary | ICD-10-CM

## 2020-03-06 DIAGNOSIS — M069 Rheumatoid arthritis, unspecified: Secondary | ICD-10-CM | POA: Diagnosis not present

## 2020-03-06 DIAGNOSIS — D692 Other nonthrombocytopenic purpura: Secondary | ICD-10-CM

## 2020-03-06 DIAGNOSIS — L570 Actinic keratosis: Secondary | ICD-10-CM

## 2020-03-06 DIAGNOSIS — Z85828 Personal history of other malignant neoplasm of skin: Secondary | ICD-10-CM | POA: Diagnosis not present

## 2020-03-06 DIAGNOSIS — L820 Inflamed seborrheic keratosis: Secondary | ICD-10-CM | POA: Diagnosis not present

## 2020-03-06 DIAGNOSIS — D239 Other benign neoplasm of skin, unspecified: Secondary | ICD-10-CM

## 2020-03-06 DIAGNOSIS — L821 Other seborrheic keratosis: Secondary | ICD-10-CM

## 2020-03-06 DIAGNOSIS — D1800 Hemangioma unspecified site: Secondary | ICD-10-CM

## 2020-03-06 DIAGNOSIS — Z1283 Encounter for screening for malignant neoplasm of skin: Secondary | ICD-10-CM

## 2020-03-06 DIAGNOSIS — L578 Other skin changes due to chronic exposure to nonionizing radiation: Secondary | ICD-10-CM

## 2020-03-06 DIAGNOSIS — L814 Other melanin hyperpigmentation: Secondary | ICD-10-CM

## 2020-03-06 HISTORY — DX: Other benign neoplasm of skin, unspecified: D23.9

## 2020-03-06 NOTE — Patient Instructions (Addendum)
Recommend Sarna Anti-itch lotion as needed for itchy skin. Use mild soaps and moisturizers daily.

## 2020-03-06 NOTE — Progress Notes (Signed)
Follow-Up Visit   Subjective  Marcus Malone is a 64 y.o. male who presents for the following: Annual Exam (Hx of numerous dysplastic nevi and BCC. Patient has noticed excessive itching especially when he is off of [place]). The patient presents for Total-Body Skin Exam (TBSE) for skin cancer screening and mole check.  The following portions of the chart were reviewed this encounter and updated as appropriate:  Tobacco  Allergies  Meds  Problems  Med Hx  Surg Hx  Fam Hx     Review of Systems:  No other skin or systemic complaints except as noted in HPI or Assessment and Plan.  Objective  Well appearing patient in no apparent distress; mood and affect are within normal limits.  A full examination was performed including scalp, head, eyes, ears, nose, lips, neck, chest, axillae, abdomen, back, buttocks, bilateral upper extremities, bilateral lower extremities, hands, feet, fingers, toes, fingernails, and toenails. All findings within normal limits unless otherwise noted below.  Objective  Internal: Clear   Objective  L mandible x 1, face x 2 (3): Erythematous keratotic or waxy stuck-on papule or plaque.   Objective  Face x 5 (5): Erythematous thin papules/macules with gritty scale.   Objective  numerous - see history: Scar with no evidence of recurrence.   Objective  L flank at the inf side: 0.5 cm irregular brown macule   Assessment & Plan    Rheumatoid arthritis  Internal  Continue Morrie Sheldon as prescribed by rheumatologist  Inflamed seborrheic keratosis (3) L mandible x 1, face x 2  Destruction of lesion - L mandible x 1, face x 2 Complexity: simple   Destruction method: cryotherapy   Informed consent: discussed and consent obtained   Timeout:  patient name, date of birth, surgical site, and procedure verified Lesion destroyed using liquid nitrogen: Yes   Region frozen until ice ball extended beyond lesion: Yes   Outcome: patient tolerated procedure  well with no complications   Post-procedure details: wound care instructions given    AK (actinic keratosis) (5) Face x 5  Destruction of lesion - Face x 5 Complexity: simple   Destruction method: cryotherapy   Informed consent: discussed and consent obtained   Timeout:  patient name, date of birth, surgical site, and procedure verified Lesion destroyed using liquid nitrogen: Yes   Region frozen until ice ball extended beyond lesion: Yes   Outcome: patient tolerated procedure well with no complications   Post-procedure details: wound care instructions given    History of dysplastic nevus numerous - see history  Clear. Observe for recurrence. Call clinic for new or changing lesions.  Recommend regular skin exams, daily broad-spectrum spf 30+ sunscreen use, and photoprotection.     Neoplasm of uncertain behavior of skin L flank at the inf side  Epidermal / dermal shaving  Lesion diameter (cm):  0.5 Informed consent: discussed and consent obtained   Timeout: patient name, date of birth, surgical site, and procedure verified   Procedure prep:  Patient was prepped and draped in usual sterile fashion Prep type:  Isopropyl alcohol Anesthesia: the lesion was anesthetized in a standard fashion   Anesthetic:  1% lidocaine w/ epinephrine 1-100,000 buffered w/ 8.4% NaHCO3 Instrument used: flexible razor blade   Hemostasis achieved with: pressure, aluminum chloride and electrodesiccation   Outcome: patient tolerated procedure well   Post-procedure details: sterile dressing applied and wound care instructions given   Dressing type: bandage and petrolatum   Additional details:  Post tx defect 0.8  cm   Specimen 1 - Surgical pathology Differential Diagnosis: D48.5 r/o dysplastic nevus Check Margins: No 0.5 cm irregular brown macule   Lentigines - Scattered tan macules - Discussed due to sun exposure - Benign, observe - Call for any changes  Seborrheic Keratoses - Stuck-on, waxy,  tan-brown papules and plaques  - Discussed benign etiology and prognosis. - Observe - Call for any changes  Melanocytic Nevi - Tan-brown and/or pink-flesh-colored symmetric macules and papules - Benign appearing on exam today - Observation - Call clinic for new or changing moles - Recommend daily use of broad spectrum spf 30+ sunscreen to sun-exposed areas.   Hemangiomas - Red papules - Discussed benign nature - Observe - Call for any changes  Actinic Damage - diffuse scaly erythematous macules with underlying dyspigmentation - Recommend daily broad spectrum sunscreen SPF 30+ to sun-exposed areas, reapply every 2 hours as needed.  - Call for new or changing lesions.  Purpura - Violaceous macules and patches - Benign - Related to age, sun damage and/or use of blood thinners - Observe - Can use OTC arnica containing moisturizer such as Dermend Bruise Formula if desired - Call for worsening or other concerns   Skin cancer screening performed today.   Return in about 5 months (around 08/06/2020) for TBSE.     Luther Redo, CMA, am acting as scribe for Sarina Ser, MD .  Documentation: I have reviewed the above documentation for accuracy and completeness, and I agree with the above.  Sarina Ser, MD

## 2020-03-09 ENCOUNTER — Encounter: Payer: Self-pay | Admitting: Dermatology

## 2020-03-10 ENCOUNTER — Telehealth: Payer: Self-pay

## 2020-03-10 NOTE — Telephone Encounter (Signed)
LM on VM to return my call. 

## 2020-03-10 NOTE — Telephone Encounter (Signed)
Patient advised of biopsy.

## 2020-03-10 NOTE — Telephone Encounter (Signed)
-----   Message from Ralene Bathe, MD sent at 03/07/2020  7:16 PM EDT ----- Skin , left flank at the inf side [place],  Dysplastic Moderate Recheck next visit

## 2020-04-08 DIAGNOSIS — I493 Ventricular premature depolarization: Secondary | ICD-10-CM | POA: Insufficient documentation

## 2020-08-05 ENCOUNTER — Other Ambulatory Visit: Payer: Self-pay

## 2020-08-05 ENCOUNTER — Ambulatory Visit: Payer: BC Managed Care – PPO | Admitting: Dermatology

## 2020-08-05 ENCOUNTER — Encounter: Payer: Self-pay | Admitting: Dermatology

## 2020-08-05 DIAGNOSIS — Z85828 Personal history of other malignant neoplasm of skin: Secondary | ICD-10-CM

## 2020-08-05 DIAGNOSIS — Z1283 Encounter for screening for malignant neoplasm of skin: Secondary | ICD-10-CM | POA: Diagnosis not present

## 2020-08-05 DIAGNOSIS — Z86018 Personal history of other benign neoplasm: Secondary | ICD-10-CM

## 2020-08-05 DIAGNOSIS — L209 Atopic dermatitis, unspecified: Secondary | ICD-10-CM

## 2020-08-05 DIAGNOSIS — D229 Melanocytic nevi, unspecified: Secondary | ICD-10-CM

## 2020-08-05 DIAGNOSIS — L578 Other skin changes due to chronic exposure to nonionizing radiation: Secondary | ICD-10-CM

## 2020-08-05 DIAGNOSIS — D1800 Hemangioma unspecified site: Secondary | ICD-10-CM

## 2020-08-05 DIAGNOSIS — L820 Inflamed seborrheic keratosis: Secondary | ICD-10-CM | POA: Diagnosis not present

## 2020-08-05 DIAGNOSIS — R21 Rash and other nonspecific skin eruption: Secondary | ICD-10-CM | POA: Diagnosis not present

## 2020-08-05 DIAGNOSIS — L821 Other seborrheic keratosis: Secondary | ICD-10-CM

## 2020-08-05 DIAGNOSIS — L814 Other melanin hyperpigmentation: Secondary | ICD-10-CM

## 2020-08-05 MED ORDER — CLINDAMYCIN PHOSPHATE 1 % EX LOTN: TOPICAL_LOTION | CUTANEOUS | 1 refills | Status: AC

## 2020-08-05 MED ORDER — MOMETASONE FUROATE 0.1 % EX OINT: TOPICAL_OINTMENT | CUTANEOUS | 0 refills | Status: AC

## 2020-08-05 NOTE — Progress Notes (Signed)
Follow-Up Visit   Subjective  Marcus Malone is a 64 y.o. male who presents for the following: Annual Exam (patient has noticed new lesions on the face that are darker. One is irritated, has been there for a few weeks, and will not resolve with Pimecrolimus ). The patient presents for Total-Body Skin Exam (TBSE) for skin cancer screening and mole check.  The following portions of the chart were reviewed this encounter and updated as appropriate:  Tobacco  Allergies  Meds  Problems  Med Hx  Surg Hx  Fam Hx     Review of Systems:  No other skin or systemic complaints except as noted in HPI or Assessment and Plan.  Objective  Well appearing patient in no apparent distress; mood and affect are within normal limits.  A full examination was performed including scalp, head, eyes, ears, nose, lips, neck, chest, axillae, abdomen, back, buttocks, bilateral upper extremities, bilateral lower extremities, hands, feet, fingers, toes, fingernails, and toenails. All findings within normal limits unless otherwise noted below.  Objective  R upper lip: 1.0 cm pink patch with crust  Images      Objective  L upper back paraspinal: Pink flat papule   Objective  Back: Pinkness with excoriations of the back   Assessment & Plan  Rash and other nonspecific skin eruption R upper lip Flare of perioral dermatitis vs abnormal growth - Start Clindamycin lotion QD and Mometasone 0.1% ointment QD x 2 weeks. Then decrease to 5d/wk. Recheck in one month.  If persistent, will plan biopsy.  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   Will plan Rx topical at fu if persistent. Atopic dermatitis (eczema) is a chronic, relapsing, pruritic condition that can  significantly affect quality of life. It is often associated with allergic rhinitis and/or asthma and can require treatment with topical medications, phototherapy, or in severe cases a biologic medication called Dupixent.   Lentigines - Scattered tan macules - Discussed due to sun exposure - Benign, observe - Call for any changes  Seborrheic Keratoses - Stuck-on, waxy, tan-brown papules and plaques  - Discussed benign etiology and prognosis. - Observe - Call for any changes  Melanocytic Nevi - Tan-brown and/or pink-flesh-colored symmetric macules and papules - Benign appearing on exam today - Observation - Call clinic for new or changing moles - Recommend daily use of broad spectrum spf 30+ sunscreen to sun-exposed areas.   Hemangiomas - Red papules - Discussed benign nature - Observe - Call for any changes  Actinic Damage - Chronic, secondary to cumulative UV/sun exposure - diffuse scaly erythematous macules with underlying dyspigmentation - Recommend daily broad spectrum sunscreen SPF 30+ to sun-exposed areas, reapply every 2 hours as needed.  - Call for new or changing lesions.  History of Basal Cell Carcinoma of the Skin - No evidence of recurrence today - Recommend regular full body skin exams - Recommend daily broad spectrum sunscreen SPF 30+ to sun-exposed areas, reapply every 2 hours as needed.  - Call if any new or changing lesions are noted between office visits  History of Dysplastic Nevi - No evidence of recurrence today - Recommend regular full body skin exams - Recommend daily broad spectrum sunscreen SPF 30+ to sun-exposed areas, reapply every 2 hours as  needed.  - Call if any new or changing lesions are noted between office visits  Skin cancer screening performed today.  Return in about 1 month (around 09/04/2020) for recheck of the right upper lip, ISK, and A.D.  I, Rudell Cobb, CMA, am acting as scribe for Sarina Ser, MD .  Documentation: I  have reviewed the above documentation for accuracy and completeness, and I agree with the above.  Sarina Ser, MD

## 2020-08-05 NOTE — Patient Instructions (Signed)
Recommend over the counter Sarna HC with hydrocortisone - as needed for itchy areas

## 2020-08-06 ENCOUNTER — Encounter: Payer: Self-pay | Admitting: Dermatology

## 2020-09-03 ENCOUNTER — Other Ambulatory Visit: Payer: Self-pay

## 2020-09-03 ENCOUNTER — Ambulatory Visit: Payer: BC Managed Care – PPO | Admitting: Dermatology

## 2020-09-03 DIAGNOSIS — R21 Rash and other nonspecific skin eruption: Secondary | ICD-10-CM

## 2020-09-03 DIAGNOSIS — D485 Neoplasm of uncertain behavior of skin: Secondary | ICD-10-CM

## 2020-09-03 DIAGNOSIS — C44519 Basal cell carcinoma of skin of other part of trunk: Secondary | ICD-10-CM

## 2020-09-03 DIAGNOSIS — C4491 Basal cell carcinoma of skin, unspecified: Secondary | ICD-10-CM

## 2020-09-03 DIAGNOSIS — L578 Other skin changes due to chronic exposure to nonionizing radiation: Secondary | ICD-10-CM

## 2020-09-03 HISTORY — DX: Basal cell carcinoma of skin, unspecified: C44.91

## 2020-09-03 NOTE — Patient Instructions (Signed)

## 2020-09-03 NOTE — Progress Notes (Signed)
   Follow-Up Visit   Subjective  Marcus Malone is a 64 y.o. male who presents for the following: recheck lesion on R upper lip  (Clear per patient after using Clindamycin lotion and Mometasone) and recheck ISK on the L upper back .  The following portions of the chart were reviewed this encounter and updated as appropriate:   Tobacco  Allergies  Meds  Problems  Med Hx  Surg Hx  Fam Hx     Review of Systems:  No other skin or systemic complaints except as noted in HPI or Assessment and Plan.  Objective  Well appearing patient in no apparent distress; mood and affect are within normal limits.  A focused examination was performed including the face and back. Relevant physical exam findings are noted in the Assessment and Plan.  Objective  R upper lip: Clear today.  Objective  L mid to upper back paraspinal: 1.1 cm pink flat papule.  Assessment & Plan  Rash - clear today R upper lip No tx needed today, but if persistent may restart Clindamycin lotion QD and Mometasone 0.1% ointment QD x 2 weeks. Then decrease to 5d/wk. RTC if flare persist more than one month.  Other Related Medications clindamycin (CLEOCIN-T) 1 % lotion mometasone (ELOCON) 0.1 % ointment  Neoplasm of uncertain behavior of skin L mid to upper back paraspinal  Epidermal / dermal shaving  Lesion diameter (cm):  1.1 Informed consent: discussed and consent obtained   Timeout: patient name, date of birth, surgical site, and procedure verified   Procedure prep:  Patient was prepped and draped in usual sterile fashion Prep type:  Isopropyl alcohol Anesthesia: the lesion was anesthetized in a standard fashion   Anesthetic:  1% lidocaine w/ epinephrine 1-100,000 buffered w/ 8.4% NaHCO3 Instrument used: flexible razor blade   Hemostasis achieved with: pressure, aluminum chloride and electrodesiccation   Outcome: patient tolerated procedure well   Post-procedure details: sterile dressing applied and wound  care instructions given   Dressing type: bandage and petrolatum    Specimen 1 - Surgical pathology Differential Diagnosis: D48.5 r/o ISK vs other  Check Margins: No 1.1 cm pink flat papule  Actinic Damage - chronic, secondary to cumulative UV radiation exposure/sun exposure over time - diffuse scaly erythematous macules with underlying dyspigmentation - Recommend daily broad spectrum sunscreen SPF 30+ to sun-exposed areas, reapply every 2 hours as needed.  - Call for new or changing lesions.  Return in about 5 months (around 02/01/2021) for TBSE.  Luther Redo, CMA, am acting as scribe for Sarina Ser, MD .  Documentation: I have reviewed the above documentation for accuracy and completeness, and I agree with the above.  Sarina Ser, MD

## 2020-09-08 ENCOUNTER — Encounter: Payer: Self-pay | Admitting: Dermatology

## 2020-09-09 ENCOUNTER — Telehealth: Payer: Self-pay

## 2020-09-09 ENCOUNTER — Other Ambulatory Visit: Payer: Self-pay

## 2020-09-09 NOTE — Progress Notes (Signed)
error 

## 2020-09-09 NOTE — Telephone Encounter (Signed)
Discussed biopsy results with pt  °

## 2020-11-03 ENCOUNTER — Ambulatory Visit: Payer: BC Managed Care – PPO | Admitting: Dermatology

## 2020-11-03 ENCOUNTER — Encounter: Payer: Self-pay | Admitting: Dermatology

## 2020-11-03 ENCOUNTER — Other Ambulatory Visit: Payer: Self-pay

## 2020-11-03 DIAGNOSIS — C44519 Basal cell carcinoma of skin of other part of trunk: Secondary | ICD-10-CM

## 2020-11-03 DIAGNOSIS — L578 Other skin changes due to chronic exposure to nonionizing radiation: Secondary | ICD-10-CM

## 2020-11-03 DIAGNOSIS — L820 Inflamed seborrheic keratosis: Secondary | ICD-10-CM

## 2020-11-03 NOTE — Patient Instructions (Signed)
Cryotherapy Aftercare  . Wash gently with soap and water everyday.   . Apply Vaseline and Band-Aid daily until healed.  Wound Care Instructions  1. Cleanse wound gently with soap and water once a day then pat dry with clean gauze. Apply a thing coat of Petrolatum (petroleum jelly, "Vaseline") over the wound (unless you have an allergy to this). We recommend that you use a new, sterile tube of Vaseline. Do not pick or remove scabs. Do not remove the yellow or white "healing tissue" from the base of the wound.  2. Cover the wound with fresh, clean, nonstick gauze and secure with paper tape. You may use Band-Aids in place of gauze and tape if the would is small enough, but would recommend trimming much of the tape off as there is often too much. Sometimes Band-Aids can irritate the skin.  3. You should call the office for your biopsy report after 1 week if you have not already been contacted.  4. If you experience any problems, such as abnormal amounts of bleeding, swelling, significant bruising, significant pain, or evidence of infection, please call the office immediately.  5. FOR ADULT SURGERY PATIENTS: If you need something for pain relief you may take 1 extra strength Tylenol (acetaminophen) AND 2 Ibuprofen (200mg each) together every 4 hours as needed for pain. (do not take these if you are allergic to them or if you have a reason you should not take them.) Typically, you may only need pain medication for 1 to 3 days.     

## 2020-11-03 NOTE — Progress Notes (Signed)
   Follow-Up Visit   Subjective  Marcus Malone is a 65 y.o. male who presents for the following: Procedure (Patient here today for [facility] to bx proven superficial BCC at left mid to upper back paraspinal. ). Patient also advises he has a new spot at left cheek.   The following portions of the chart were reviewed this encounter and updated as appropriate:   Tobacco  Allergies  Meds  Problems  Med Hx  Surg Hx  Fam Hx     Review of Systems:  No other skin or systemic complaints except as noted in HPI or Assessment and Plan.  Objective  Well appearing patient in no apparent distress; mood and affect are within normal limits.  A focused examination was performed including face, neck, chest and back. Relevant physical exam findings are noted in the Assessment and Plan.  Objective  Left Mid to  Upper Back Paraspinal: Healing bx site  Objective  left cheek: Erythematous keratotic or waxy stuck-on papule or plaque.    Assessment & Plan  Basal cell carcinoma (BCC) of skin of other part of torso Left Mid to  Upper Back Paraspinal  Destruction of lesion Complexity: extensive   Destruction method: electrodesiccation and curettage   Informed consent: discussed and consent obtained   Timeout:  patient name, date of birth, surgical site, and procedure verified Procedure prep:  Patient was prepped and draped in usual sterile fashion Prep type:  Isopropyl alcohol Anesthesia: the lesion was anesthetized in a standard fashion   Anesthetic:  1% lidocaine w/ epinephrine 1-100,000 buffered w/ 8.4% NaHCO3 Curettage performed in three different directions: Yes   Electrodesiccation performed over the curetted area: Yes   Final wound size (cm):  1.5 Hemostasis achieved with:  pressure, aluminum chloride and electrodesiccation Outcome: patient tolerated procedure well with no complications   Post-procedure details: sterile dressing applied and wound care instructions given   Dressing type:  bandage and petrolatum    Inflamed seborrheic keratosis left cheek  Destruction of lesion - left cheek Complexity: simple   Destruction method: cryotherapy   Informed consent: discussed and consent obtained   Timeout:  patient name, date of birth, surgical site, and procedure verified Lesion destroyed using liquid nitrogen: Yes   Region frozen until ice ball extended beyond lesion: Yes   Outcome: patient tolerated procedure well with no complications   Post-procedure details: wound care instructions given    Actinic Damage - chronic, secondary to cumulative UV radiation exposure/sun exposure over time - diffuse scaly erythematous macules with underlying dyspigmentation - Recommend daily broad spectrum sunscreen SPF 30+ to sun-exposed areas, reapply every 2 hours as needed.  - Call for new or changing lesions.  Return for as scheduled for TBSE.  Graciella Belton, RMA, am acting as scribe for Sarina Ser, MD . Documentation: I have reviewed the above documentation for accuracy and completeness, and I agree with the above.  Sarina Ser, MD

## 2020-11-04 ENCOUNTER — Other Ambulatory Visit: Payer: Self-pay | Admitting: General Surgery

## 2020-11-05 ENCOUNTER — Other Ambulatory Visit: Payer: Self-pay | Admitting: General Surgery

## 2020-11-05 NOTE — Progress Notes (Signed)
Subjective:     Patient ID: Marcus Malone is a 65 y.o. male.  HPI  The following portions of the patient's history were reviewed and updated as appropriate.  This an established patient is here today for: office visit. The patient has been referred today for evaluation of a left inguinal hernia. Patient reports he started hurting mid May and at that time he went to the ED and had a CT scan. The patient thought he had a pulled muscle. Also, reports he started hurting again in January this year in the same area and thought he had another muscle strain. Patient went to see his PCP, Dr. Hall Busing and was told that he has a hernia. Patient states he cannot walk more than 20-30 yards without having the discomfort. The patient also states he does not have regular bowel movements. He can go at times 3-4 days without a bowel movement or up to 5 times in a day. The patient is due for a colonoscopy in May this year.  The patient reports making use of a "shot of vodka" at night to help with sleep.  Long history of constipation, required a 2-day prep at the time of his 2017 colonoscopy.  Prior exam in 2011 had been compromised by poor prep.      Chief Complaint  Patient presents with  . Inguinal Hernia    left, symptomatic     Pulse 85   Temp 36.8 C (98.2 F)   Ht 185.4 cm (6\' 1" )   Wt 69.4 kg (153 lb)   SpO2 97%   BMI 20.19 kg/m       Past Medical History:  Diagnosis Date  . Basal cell carcinoma 09/03/2020   left mid to upper back paraspinal  . Basal cell carcinoma 09/21/2004   left superior lateral deltiod  . Depression   . Dysplastic nevus   . Heart murmur, unspecified   . Hypertension   . Osteopenia   . Prostatitis   . Rheumatoid arthritis (CMS-HCC)      Past Surgical History:  Procedure Laterality Date  . COLONOSCOPY  02/04/2016  . COLONOSCOPY  09/11/2010  . history of skin lesion excision  2011       Social History          Socioeconomic  History  . Marital status: Married    Spouse name: Not on file  . Number of children: 1  . Years of education: Not on file  . Highest education level: Not on file  Occupational History  . Occupation: Pharmacist, hospital  Tobacco Use  . Smoking status: Never Smoker  . Smokeless tobacco: Never Used  Substance and Sexual Activity  . Alcohol use: Yes  . Drug use: Not on file  . Sexual activity: Not on file  Other Topics Concern  . Not on file  Social History Narrative  . Not on file   Social Determinants of Health   Financial Resource Strain: Not on file  Food Insecurity: Not on file  Transportation Needs: Not on file            Allergies  Allergen Reactions  . Adhesive Tape-Silicones Unknown  . Shellfish Containing Products Nausea    Stomach pain  . Neomycin-Bacitracin-Polymyxin Rash    Current Medications        Current Outpatient Medications  Medication Sig Dispense Refill  . acyclovir (ZOVIRAX) 200 MG capsule Take 200 mg by mouth as needed    . alendronate (FOSAMAX) 70 MG tablet 1  tablet as directed Orally 7 day(s)  0  . amitriptyline (ELAVIL) 50 MG tablet Take 50 mg by mouth every other day.     Marland Kitchen ascorbic acid (VITAMIN C) 1000 MG tablet Take 1,000 mg by mouth 2 (two) times daily.    . calcium carbonate 600 mg calcium (1,500 mg) Tab tablet 1 tablet with meals    . clindamycin (CLEOCIN T) 1 % lotion APPLY TOPICALLY TO FACE EVERY MORNING    . diltiazem (TIAZAC) 360 MG ER capsule Take 360 mg by mouth once daily.    Marland Kitchen docusate (COLACE) 100 MG capsule Take 100 mg by mouth 2 (two) times daily       . famciclovir (FAMVIR) 500 MG tablet 500 mg as needed.     . folic acid (FOLVITE) 1 MG tablet Take 2,000 mg by mouth once daily.    . hydrocortisone 2.5 % cream APPLY ON THE SKIN ON FACE QD FOR UP TO 4 DAYS UTD FOR ECZEMA UNTIL CLEAR    . magnesium citrate 125 mg Cap See admin instructions.    . meloxicam (MOBIC) 15 MG tablet Take 15 mg by mouth once  daily as needed.      . mometasone (ELOCON) 0.1 % ointment Apply to the face QHS x 2 weeks then decrease to 5d/wk.    . multivitamin with iron tablet Take 1 tablet by mouth once daily.    Marland Kitchen omega-3 fatty acids (FISH OIL CONCENTRATE) 1,000 mg capsule Take 1 g by mouth 2 (two) times daily. Takes 2 cap in am and 2 caps in pm    . polyethylene glycol (MIRALAX) powder Take 17 g by mouth as needed       . potassium gluconate 595 mg (99 mg) Tab Take by mouth 2 (two) times daily.      Marland Kitchen PREVIDENT 5000 PLUS 1.1 % USE UTD ONCE DAILY    . red yeast rice 600 mg Cap Take 600 capsules by mouth. 4 tablets at pm     . tofacitinib (XELJANZ XR) 11 mg Tb24 Take 11 mg by mouth once daily    . triamcinolone 0.1 % cream     . zolpidem (AMBIEN) 10 mg tablet Take 1 tablet by mouth nightly    . aspirin (ECOTRIN) 325 MG EC tablet Take 325 mg by mouth once daily. (Patient not taking: Reported on 11/04/2020  )    . cholecalciferol (VITAMIN D3) 2,000 unit capsule Take 1,000 Units by mouth once daily.  (Patient not taking: Reported on 11/04/2020  )    . methotrexate (RHEUMATREX) 2.5 MG tablet  (Patient not taking: Reported on 11/04/2020  )    . SAW PALMETTO XT/PHYTOSTEROL #2 (PROSTATE SR ORAL) Take 2 capsules by mouth once daily. (Patient not taking: Reported on 11/04/2020  )    . sildenafiL (VIAGRA) 50 MG tablet Take 50 mg by mouth once daily as needed (Patient not taking: Reported on 11/04/2020  )     No current facility-administered medications for this visit.           Family History  Problem Relation Age of Onset  . Myocardial Infarction (Heart attack) Mother   . Colon polyps Mother   . Myocardial Infarction (Heart attack) Father   . Colon polyps Maternal Uncle   . Colon cancer Maternal Uncle   . Breast cancer Neg Hx       Review of Systems  Constitutional: Negative for chills and fever.  Respiratory: Negative for cough.  Objective:   Physical  Exam Constitutional:      Appearance: Normal appearance.  Cardiovascular:     Rate and Rhythm: Normal rate and regular rhythm.     Pulses: Normal pulses.     Heart sounds: Normal heart sounds.  Pulmonary:     Effort: Pulmonary effort is normal.     Breath sounds: Normal breath sounds.  Abdominal:     Hernia: A hernia is present. Hernia is present in the left inguinal area.  Genitourinary:    Testes: Normal.       Comments: This visual bulge in the left inguinal area, spontaneously reduces when supine.  Markedly tender to touch.  No erythema or overlying skin changes. Neurological:     Mental Status: He is alert and oriented to person, place, and time.  Psychiatric:        Mood and Affect: Mood normal.        Behavior: Behavior normal.    Labs and Radiology:   CT of the abdomen and pelvis dated 02/18/2020:  Independently reviewed, study obtained for abdominal discomfort after prior respiratory infection.:  IMPRESSION: 1. Moderate to large volume stool in the RIGHT colon suggest constipation. 2. No diverticulosis. 3. Fat filled LEFT inguinal hernia. 4. Benign appearing RIGHT renal cysts.  02/18/2020 laboratory review:  CBC normal.  Potassium 3.4.  Blood sugar 168.  Normal liver function studies.  02/04/2016 colonoscopy for family history colon cancer:  Normal.   Up-to-date review regarding his biologic showed no contraindication to continued use prior to elective surgery.     Assessment:     Very symptomatic left inguinal hernia.    Plan:     Indications for repair reviewed.  Role for prosthetic mesh discussed.  Potential referral if laparoscopic/robotic repair desired, declined.     Recommend 2 Aleve twice a day until surgery.  (Patient is not routinely making use of Mobic).  Patient to be scheduled for hernia surgery next week.   Entered by Ledell Noss, CMA, acting as a scribe for Dr. Hervey Ard, MD.   The documentation recorded by the  scribe accurately reflects the service I personally performed and the decisions made by me.   [name], MD FACS

## 2020-11-06 ENCOUNTER — Other Ambulatory Visit: Payer: Self-pay

## 2020-11-06 ENCOUNTER — Other Ambulatory Visit
Admission: RE | Admit: 2020-11-06 | Discharge: 2020-11-06 | Disposition: A | Discharge status: Home / Self Care | Payer: BC Managed Care – PPO | Source: Ambulatory Visit | Attending: General Surgery | Admitting: General Surgery

## 2020-11-06 DIAGNOSIS — Z20822 Contact with and (suspected) exposure to covid-19: Secondary | ICD-10-CM | POA: Diagnosis not present

## 2020-11-06 DIAGNOSIS — Z01812 Encounter for preprocedural laboratory examination: Secondary | ICD-10-CM | POA: Insufficient documentation

## 2020-11-06 LAB — SARS CORONAVIRUS 2 (TAT 6-24 HRS): SARS Coronavirus 2: NEGATIVE

## 2020-11-07 ENCOUNTER — Encounter
Admission: RE | Admit: 2020-11-07 | Discharge: 2020-11-07 | Disposition: A | Discharge status: Home / Self Care | Payer: BC Managed Care – PPO | Source: Ambulatory Visit | Attending: General Surgery | Admitting: General Surgery

## 2020-11-07 NOTE — Patient Instructions (Signed)
Your procedure is scheduled on: November 10, 2020 Monday  Report to the Registration Desk on the 1st floor of the [facility]. To find out your arrival time, please call [phone] between 1PM - 3PM on: Friday November 07, 2020  REMEMBER: Instructions that are not followed completely may result in serious medical risk, up to and including death; or upon the discretion of your surgeon and anesthesiologist your surgery may need to be rescheduled.  Do not eat food after midnight the night before surgery.  No gum chewing, lozengers or hard candies.  You may however, drink CLEAR liquids up to 2 hours before you are scheduled to arrive for your surgery. Do not drink anything within 2 hours of your scheduled arrival time.  Clear liquids include: - water  - apple juice without pulp - gatorade (not RED, PURPLE, OR BLUE) - black coffee or tea (Do NOT add milk or creamers to the coffee or tea) Do NOT drink anything that is not on this list.   TAKE THESE MEDICATIONS THE MORNING OF SURGERY WITH A SIP OF WATER: DILTIAZEM  PT MAY TAKE ALEVE 2 TIMES A DAY PER DR BYRNETT  One week prior to surgery: Stop Anti-inflammatories (NSAIDS) such as Advil,  Ibuprofen, Motrin, Naproxen, Naprosyn and Aspirin based products such as Excedrin, Goodys Powder, BC Powder. Stop ANY OVER THE COUNTER supplements until after surgery. (However, you may continue taking multivitamin AND POTASSIUM up until the day before surgery.)  No Alcohol for 24 hours before or after surgery.  No Smoking including e-cigarettes for 24 hours prior to surgery.  No chewable tobacco products for at least 6 hours prior to surgery.  No nicotine patches on the day of surgery.  Do not use any "recreational" drugs for at least a week prior to your surgery.  Please be advised that the combination of cocaine and anesthesia may have negative outcomes, up to and including death. If you test positive for cocaine, your surgery will be  cancelled.  On the morning of surgery brush your teeth with toothpaste and water, you may rinse your mouth with mouthwash if you wish. Do not swallow any toothpaste or mouthwash.  Do not wear jewelry, make-up, hairpins, clips or nail polish.  Do not wear lotions, powders, or perfumes.   Do not shave body from the neck down 48 hours prior to surgery just in case you cut yourself which could leave a site for infection.  Also, freshly shaved skin may become irritated if using the CHG soap.  Contact lenses, hearing aids and dentures may not be worn into surgery.  Do not bring valuables to the hospital. [facility] is not responsible for any missing/lost belongings or valuables.   SHOWER DAY OF SURGERY  Notify your doctor if there is any change in your medical condition (cold, fever, infection).  Wear comfortable clothing (specific to your surgery type) to the hospital.  Plan for stool softeners for home use; pain medications have a tendency to cause constipation. You can also help prevent constipation by eating foods high in fiber such as fruits and vegetables and drinking plenty of fluids as your diet allows.  After surgery, you can help prevent lung complications by doing breathing exercises.  Take deep breaths and cough every 1-2 hours. Your doctor may order a device called an Incentive Spirometer to help you take deep breaths. When coughing or sneezing, hold a pillow firmly against your incision with both hands. This is called "splinting." Doing this helps protect  your incision. It also decreases belly discomfort.  If you are being discharged the day of surgery, you will not be allowed to drive home. You will need a responsible adult (18 years or older) to drive you home and stay with you that night.   Please call the [facility] Dept. at [phone] if you have any questions about these instructions.  Visitation Policy:  Patients undergoing a surgery or procedure  may have one family member or support person with them as long as that person is not COVID-19 positive or experiencing its symptoms.  That person may remain in the waiting area during the procedure.  Inpatient Visitation:    Visiting hours are 7 a.m. to 8 p.m. Patients will be allowed one visitor. The visitor may change daily. The visitor must pass COVID-19 screenings, use hand sanitizer when entering and exiting the patient's room and wear a mask at all times, including in the patient's room. Patients must also wear a mask when staff or their visitor are in the room. Masking is required regardless of vaccination status. Systemwide, no visitors 17 or younger.

## 2020-11-09 MED ORDER — FAMOTIDINE 20 MG PO TABS: 20.0000 mg | ORAL_TABLET | Freq: Once | ORAL | Status: AC

## 2020-11-09 MED ORDER — CEFAZOLIN SODIUM-DEXTROSE 2-4 GM/100ML-% IV SOLN
2.0000 g | INTRAVENOUS | Status: AC
  Administered 2020-11-10: 2 g via INTRAVENOUS

## 2020-11-09 MED ORDER — CHLORHEXIDINE GLUCONATE CLOTH 2 % EX PADS: 6.0000 | MEDICATED_PAD | Freq: Once | CUTANEOUS | Status: DC

## 2020-11-10 ENCOUNTER — Other Ambulatory Visit: Payer: Self-pay

## 2020-11-10 ENCOUNTER — Encounter: Payer: Self-pay | Admitting: General Surgery

## 2020-11-10 ENCOUNTER — Ambulatory Visit: Payer: BC Managed Care – PPO | Admitting: Family

## 2020-11-10 ENCOUNTER — Ambulatory Visit
Admission: RE | Admit: 2020-11-10 | Discharge: 2020-11-10 | Disposition: A | Discharge status: Home / Self Care | Payer: BC Managed Care – PPO | Attending: General Surgery | Admitting: General Surgery

## 2020-11-10 ENCOUNTER — Encounter
Admission: RE | Disposition: A | Discharge status: Home / Self Care | Payer: Self-pay | Source: Home / Self Care | Attending: General Surgery

## 2020-11-10 DIAGNOSIS — D176 Benign lipomatous neoplasm of spermatic cord: Secondary | ICD-10-CM | POA: Diagnosis not present

## 2020-11-10 DIAGNOSIS — Z888 Allergy status to other drugs, medicaments and biological substances status: Secondary | ICD-10-CM | POA: Diagnosis not present

## 2020-11-10 DIAGNOSIS — K4090 Unilateral inguinal hernia, without obstruction or gangrene, not specified as recurrent: Secondary | ICD-10-CM | POA: Insufficient documentation

## 2020-11-10 DIAGNOSIS — Z7983 Long term (current) use of bisphosphonates: Secondary | ICD-10-CM | POA: Diagnosis not present

## 2020-11-10 DIAGNOSIS — Z91013 Allergy to seafood: Secondary | ICD-10-CM | POA: Diagnosis not present

## 2020-11-10 DIAGNOSIS — Z85828 Personal history of other malignant neoplasm of skin: Secondary | ICD-10-CM | POA: Diagnosis not present

## 2020-11-10 DIAGNOSIS — Z791 Long term (current) use of non-steroidal anti-inflammatories (NSAID): Secondary | ICD-10-CM | POA: Diagnosis not present

## 2020-11-10 DIAGNOSIS — Z79899 Other long term (current) drug therapy: Secondary | ICD-10-CM | POA: Diagnosis not present

## 2020-11-10 HISTORY — PX: INGUINAL HERNIA REPAIR: SHX194

## 2020-11-10 SURGERY — REPAIR, HERNIA, INGUINAL, ADULT
Anesthesia: General | Site: Groin | Laterality: Left

## 2020-11-10 MED ORDER — OXYCODONE HCL 5 MG PO TABS
5.0000 mg | ORAL_TABLET | Freq: Once | ORAL | Status: AC | PRN
  Administered 2020-11-10: 5 mg via ORAL

## 2020-11-10 MED ORDER — OXYCODONE HCL 5 MG/5ML PO SOLN: 5.0000 mg | Freq: Once | ORAL | Status: AC | PRN

## 2020-11-10 MED ORDER — OXYCODONE HCL 5 MG PO TABS
ORAL_TABLET | ORAL | Status: AC
  Filled 2020-11-10: qty 1

## 2020-11-10 MED ORDER — LACTATED RINGERS IV SOLN: INTRAVENOUS | Status: DC

## 2020-11-10 MED ORDER — ONDANSETRON HCL 4 MG/2ML IJ SOLN
INTRAMUSCULAR | Status: DC | PRN
  Administered 2020-11-10: 4 mg via INTRAVENOUS

## 2020-11-10 MED ORDER — MIDAZOLAM HCL 2 MG/2ML IJ SOLN
INTRAMUSCULAR | Status: AC
  Filled 2020-11-10: qty 2

## 2020-11-10 MED ORDER — FENTANYL CITRATE (PF) 100 MCG/2ML IJ SOLN
INTRAMUSCULAR | Status: DC | PRN
  Administered 2020-11-10: 50 ug via INTRAVENOUS

## 2020-11-10 MED ORDER — FENTANYL CITRATE (PF) 100 MCG/2ML IJ SOLN: 25.0000 ug | INTRAMUSCULAR | Status: DC | PRN

## 2020-11-10 MED ORDER — LIDOCAINE HCL (PF) 2 % IJ SOLN
INTRAMUSCULAR | Status: AC
  Filled 2020-11-10: qty 5

## 2020-11-10 MED ORDER — DEXAMETHASONE SODIUM PHOSPHATE 10 MG/ML IJ SOLN
INTRAMUSCULAR | Status: AC
  Filled 2020-11-10: qty 1

## 2020-11-10 MED ORDER — DEXAMETHASONE SODIUM PHOSPHATE 10 MG/ML IJ SOLN
INTRAMUSCULAR | Status: DC | PRN
  Administered 2020-11-10: 10 mg via INTRAVENOUS

## 2020-11-10 MED ORDER — ACETAMINOPHEN 10 MG/ML IV SOLN
INTRAVENOUS | Status: AC
  Filled 2020-11-10: qty 100

## 2020-11-10 MED ORDER — MIDAZOLAM HCL 2 MG/2ML IJ SOLN: 2.0000 mg | Freq: Once | INTRAMUSCULAR | Status: AC

## 2020-11-10 MED ORDER — ROCURONIUM BROMIDE 10 MG/ML (PF) SYRINGE
PREFILLED_SYRINGE | INTRAVENOUS | Status: AC
  Filled 2020-11-10: qty 10

## 2020-11-10 MED ORDER — BUPIVACAINE HCL (PF) 0.5 % IJ SOLN
INTRAMUSCULAR | Status: AC
  Filled 2020-11-10: qty 30

## 2020-11-10 MED ORDER — ACETAMINOPHEN 10 MG/ML IV SOLN
INTRAVENOUS | Status: DC | PRN
  Administered 2020-11-10: 1000 mg via INTRAVENOUS

## 2020-11-10 MED ORDER — FENTANYL CITRATE (PF) 100 MCG/2ML IJ SOLN
INTRAMUSCULAR | Status: AC
  Filled 2020-11-10: qty 2

## 2020-11-10 MED ORDER — KETOROLAC TROMETHAMINE 30 MG/ML IJ SOLN
INTRAMUSCULAR | Status: DC | PRN
  Administered 2020-11-10: 30 mg via INTRAMUSCULAR

## 2020-11-10 MED ORDER — MIDAZOLAM HCL 2 MG/2ML IJ SOLN
INTRAMUSCULAR | Status: AC
  Administered 2020-11-10: 2 mg via INTRAVENOUS
  Filled 2020-11-10: qty 2

## 2020-11-10 MED ORDER — LIDOCAINE HCL (CARDIAC) PF 100 MG/5ML IV SOSY
PREFILLED_SYRINGE | INTRAVENOUS | Status: DC | PRN
  Administered 2020-11-10: 80 mg via INTRAVENOUS

## 2020-11-10 MED ORDER — ONDANSETRON HCL 4 MG/2ML IJ SOLN: 4.0000 mg | Freq: Once | INTRAMUSCULAR | Status: DC | PRN

## 2020-11-10 MED ORDER — BUPIVACAINE-EPINEPHRINE (PF) 0.5% -1:200000 IJ SOLN
INTRAMUSCULAR | Status: DC | PRN
  Administered 2020-11-10: 20 mL via PERINEURAL

## 2020-11-10 MED ORDER — OXYCODONE-ACETAMINOPHEN 5-325 MG PO TABS: 1.0000 | ORAL_TABLET | ORAL | 0 refills | Status: AC | PRN

## 2020-11-10 MED ORDER — CHLORHEXIDINE GLUCONATE 0.12 % MT SOLN: 15.0000 mL | Freq: Once | OROMUCOSAL | Status: AC

## 2020-11-10 MED ORDER — CHLORHEXIDINE GLUCONATE 0.12 % MT SOLN
OROMUCOSAL | Status: AC
  Administered 2020-11-10: 15 mL via OROMUCOSAL
  Filled 2020-11-10: qty 15

## 2020-11-10 MED ORDER — EPINEPHRINE PF 1 MG/ML IJ SOLN
INTRAMUSCULAR | Status: AC
  Filled 2020-11-10: qty 1

## 2020-11-10 MED ORDER — PROPOFOL 10 MG/ML IV BOLUS
INTRAVENOUS | Status: DC | PRN
  Administered 2020-11-10: 100 mg via INTRAVENOUS
  Administered 2020-11-10: 150 mg via INTRAVENOUS

## 2020-11-10 MED ORDER — ONDANSETRON HCL 4 MG/2ML IJ SOLN
INTRAMUSCULAR | Status: AC
  Filled 2020-11-10: qty 2

## 2020-11-10 MED ORDER — PROPOFOL 10 MG/ML IV BOLUS
INTRAVENOUS | Status: AC
  Filled 2020-11-10: qty 40

## 2020-11-10 MED ORDER — CEFAZOLIN SODIUM-DEXTROSE 2-4 GM/100ML-% IV SOLN
INTRAVENOUS | Status: AC
  Filled 2020-11-10: qty 100

## 2020-11-10 MED ORDER — FAMOTIDINE 20 MG PO TABS
ORAL_TABLET | ORAL | Status: AC
  Administered 2020-11-10: 20 mg via ORAL
  Filled 2020-11-10: qty 1

## 2020-11-10 MED ORDER — ORAL CARE MOUTH RINSE: 15.0000 mL | Freq: Once | OROMUCOSAL | Status: AC

## 2020-11-10 SURGICAL SUPPLY — 36 items
APL PRP STRL LF DISP 70% ISPRP (MISCELLANEOUS) ×1
BLADE SURG 15 STRL SS SAFETY (BLADE) ×4 IMPLANT
CANISTER SUCT 1200ML W/VALVE (MISCELLANEOUS) ×2 IMPLANT
CHLORAPREP W/TINT 26 (MISCELLANEOUS) ×2 IMPLANT
COVER WAND RF STERILE (DRAPES) ×2 IMPLANT
DECANTER SPIKE VIAL GLASS SM (MISCELLANEOUS) ×2 IMPLANT
DRAIN PENROSE 1/4X12 LTX STRL (WOUND CARE) ×2 IMPLANT
DRAPE LAPAROTOMY 100X77 ABD (DRAPES) ×2 IMPLANT
DRSG TEGADERM 4X4.75 (GAUZE/BANDAGES/DRESSINGS) ×2 IMPLANT
DRSG TELFA 4X3 1S NADH ST (GAUZE/BANDAGES/DRESSINGS) ×2 IMPLANT
ELECT REM PT RETURN 9FT ADLT (ELECTROSURGICAL) ×2
ELECTRODE REM PT RTRN 9FT ADLT (ELECTROSURGICAL) ×1 IMPLANT
GLOVE INDICATOR 8.0 STRL GRN (GLOVE) ×2 IMPLANT
GLOVE SURG ENC MOIS LTX SZ7.5 (GLOVE) ×2 IMPLANT
GOWN STRL REUS W/ TWL LRG LVL3 (GOWN DISPOSABLE) ×2 IMPLANT
GOWN STRL REUS W/TWL LRG LVL3 (GOWN DISPOSABLE) ×4
KIT TURNOVER KIT A (KITS) ×2 IMPLANT
LABEL OR SOLS (LABEL) ×2 IMPLANT
MANIFOLD NEPTUNE II (INSTRUMENTS) ×2 IMPLANT
MESH HERNIA 6X12 ULTRAPRO MED (Mesh General) ×1 IMPLANT
MESH HERNIA ULTRAPRO MED (Mesh General) ×1 IMPLANT
NEEDLE HYPO 22GX1.5 SAFETY (NEEDLE) ×4 IMPLANT
PACK BASIN MINOR ARMC (MISCELLANEOUS) ×2 IMPLANT
STRIP CLOSURE SKIN 1/2X4 (GAUZE/BANDAGES/DRESSINGS) ×2 IMPLANT
SUT PDS AB 0 CT1 27 (SUTURE) ×1 IMPLANT
SUT SURGILON 0 BLK (SUTURE) ×3 IMPLANT
SUT VIC AB 2-0 SH 27 (SUTURE) ×2
SUT VIC AB 2-0 SH 27XBRD (SUTURE) ×1 IMPLANT
SUT VIC AB 3-0 54X BRD REEL (SUTURE) ×1 IMPLANT
SUT VIC AB 3-0 BRD 54 (SUTURE) ×2
SUT VIC AB 3-0 SH 27 (SUTURE) ×2
SUT VIC AB 3-0 SH 27X BRD (SUTURE) ×1 IMPLANT
SUT VIC AB 4-0 FS2 27 (SUTURE) ×2 IMPLANT
SWABSTK COMLB BENZOIN TINCTURE (MISCELLANEOUS) ×2 IMPLANT
SYR 10ML LL (SYRINGE) ×2 IMPLANT
SYR 3ML LL SCALE MARK (SYRINGE) ×2 IMPLANT

## 2020-11-10 NOTE — Anesthesia Preprocedure Evaluation (Addendum)
Anesthesia Evaluation  Patient identified by MRN, date of birth, ID band Patient awake    Reviewed: Allergy & Precautions, H&P , NPO status , Patient's Chart, lab work & pertinent test results  History of Anesthesia Complications Negative for: history of anesthetic complications  Airway Mallampati: II  TM Distance: >3 FB     Dental   Pulmonary neg pulmonary ROS, neg sleep apnea, neg COPD,    breath sounds clear to auscultation       Cardiovascular hypertension, (-) angina(-) Past MI and (-) Cardiac Stents (-) dysrhythmias  Rhythm:regular Rate:Normal     Neuro/Psych PSYCHIATRIC DISORDERS Anxiety negative neurological ROS     GI/Hepatic negative GI ROS, Neg liver ROS,   Endo/Other  negative endocrine ROS  Renal/GU      Musculoskeletal  (+) Arthritis , Rheumatoid disorders,    Abdominal   Peds  Hematology negative hematology ROS (+)   Anesthesia Other Findings Very anxious  Past Medical History: No date: Arthritis     Comment:  Rheumatiod 03/06/2020: Dysplastic nevus     Comment:  Left flank at the inf. side. Moderate atypia, peripheral              and deep margins involved.  No date: Family history of colonic polyps 09/10/2010: Hx of atypical nevus     Comment:  L ant med deltoid 09/21/2004: Hx of basal cell carcinoma     Comment:  L superior lateral deltoid 09/03/2020: Hx of basal cell carcinoma     Comment:  L mid to upper back paraspinal, [facility] 11/03/20 09/06/2007: Hx of dysplastic nevus     Comment:  L lat abd/side, moderate atypia 10/30/2008: Hx of dysplastic nevus     Comment:  R low back paraspinal,moderate to marked atypia 09/16/2008: Hx of dysplastic nevus     Comment:  L post deltoid, moderate atypia 03/31/2009: Hx of dysplastic nevus     Comment:  R mid back, 7.0cm lat to spine, moderate atypia 10/07/2009: Hx of dysplastic nevus     Comment:  R upper back 2.0cm lat to spine, mild  atypia 10/07/2009: Hx of dysplastic nevus     Comment:  L upper back, 3.0cm lat to spine, moderate atypia 01/27/2010: Hx of dysplastic nevus     Comment:  R xiphoid, mild atypia 01/27/2010: Hx of dysplastic nevus     Comment:  L umbilicus at 4 o'clock, moderate atypia 02/11/2010: Hx of dysplastic nevus     Comment:  LLQA above exc scar, mod to severe atypia 01/27/2010: Hx of dysplastic nevus     Comment:  L low back 4.0cm lat to spine, moderate atypia 01/27/2010: Hx of dysplastic nevus     Comment:  L med distal thigh, mild atypia 05/05/2010: Hx of dysplastic nevus     Comment:  R distal lat deltoid, mild to mod atypia 05/05/2010: Hx of dysplastic nevus     Comment:  R costal margin, 4.0cm infer to R pectoral, mild to mod               atypia 05/05/2010: Hx of dysplastic nevus     Comment:  R lat ant heel, mod to severe atypia 05/05/2010: Hx of dysplastic nevus     Comment:  R low back lat, 7.0cm lat to spine, moderate atypia 05/05/2010: Hx of dysplastic nevus     Comment:  low back medial, 4.5cm lat to spin, mild to mod atypia 06/06/2010: Hx of dysplastic nevus     Comment:  R  low back lat flank, 8.0cm lat to spine, mild atypia 06/06/2010: Hx of dysplastic nevus     Comment:  L mid back, 8.0cm lat to spine, mild atypia 06/06/2010: Hx of dysplastic nevus     Comment:  R lat abdomen near side, 69CV lat to umbilicua, mild               atypia 06/06/2010: Hx of dysplastic nevus     Comment:  R epigastric inderior, mild atypia 06/06/2010: Hx of dysplastic nevus     Comment:  R epigastric sup lateral, mild atypia 06/17/2010: Hx of dysplastic nevus     Comment:  L xyphoid, mild atypia 06/17/2010: Hx of dysplastic nevus     Comment:  R distal lat tricep, mild atypia 08/20/2010: Hx of dysplastic nevus     Comment:  LUQQ, moderate atypia 09/10/2010: Hx of dysplastic nevus     Comment:  R lat neck, moderate to severe 08/20/2010: Hx of dysplastic nevus     Comment:  L low back lat,  14.0cm lat to spine, mild to moderate               atypia 09/10/2010: Hx of dysplastic nevus     Comment:  R upper back paraspinal, mild to moderate atypia 09/10/2010: Hx of dysplastic nevus     Comment:  L buttock lateral, mild atypia 09/10/2010: Hx of dysplastic nevus     Comment:  R med ankle at super post inner aspect of the med               malleolus, mild to mod atypia 05/21/2014: Hx of dysplastic nevus     Comment:  L sup medial buttock,mild atypia 05/21/2014: Hx of dysplastic nevus     Comment:  R mid lat bicep, moderate atypia 10/03/2014: Hx of dysplastic nevus     Comment:  R medial infrapectoral, moderate to severe atypia,               reshave 11/04/2014 10/03/2014: Hx of dysplastic nevus     Comment:  Inferior to xyphoid epigastric, moderate atypia 10/03/2014: Hx of dysplastic nevus     Comment:  L medial costal area, moderate atypia 07/15/2015: Hx of dysplastic nevus     Comment:  R proximal lateral tricep, mild atypia 07/15/2015: Hx of dysplastic nevus     Comment:  R low back lat post flank med, mild atypia 07/15/2015: Hx of dysplastic nevus     Comment:  R low back post lat flank lat, moderate atypia 07/15/2015: Hx of dysplastic nevus     Comment:  R low back lat pos flank inf, mild atypia 12/16/2015: Hx of dysplastic nevus     Comment:  L sup med edge of areola, mild atypia 08/23/2019: Hx of dysplastic nevus     Comment:  R mid back 7.0cm lat to spine, mod to severe atypia,               shave removal 09/12/19 08/23/2019: Hx of dysplastic nevus     Comment:  R mid back 8.5cm lat to spine, moderate atypia 2008: Hypertension No date: Osteoporosis No date: Prostatitis  Past Surgical History: 09/11/2010: COLONOSCOPY 02/04/2016: COLONOSCOPY WITH PROPOFOL; N/A     Comment:  Procedure: COLONOSCOPY WITH PROPOFOL;  Surgeon: [name], MD;  Location: [facility] ENDOSCOPY;  Service:  Endoscopy;  Laterality: N/A; 2011: SKIN LESION  EXCISION  BMI    Body Mass Index: 20.16 kg/m      Reproductive/Obstetrics negative OB ROS                          Anesthesia Physical Anesthesia Plan  ASA: II  Anesthesia Plan: General LMA   Post-op Pain Management:    Induction:   PONV Risk Score and Plan: Ondansetron, Dexamethasone, Midazolam and Treatment may vary due to age or medical condition  Airway Management Planned:   Additional Equipment:   Intra-op Plan:   Post-operative Plan:   Informed Consent: I have reviewed the patients History and Physical, chart, labs and discussed the procedure including the risks, benefits and alternatives for the proposed anesthesia with the patient or authorized representative who has indicated his/her understanding and acceptance.     Dental Advisory Given  Plan Discussed with: Anesthesiologist, CRNA and Surgeon  Anesthesia Plan Comments:        Anesthesia Quick Evaluation

## 2020-11-10 NOTE — H&P (Signed)
CARLUS STAY [id number] 08/13/1956     HPI:  Healthy 65 y/o male with a symptomatic left inguinal hernia. For repair.   Medications Prior to Admission  Medication Sig Dispense Refill Last Dose  . alendronate (FOSAMAX) 70 MG tablet Take 70 mg by mouth once a week.   Past Week at Unknown time  . ALPRAZolam (XANAX) 0.5 MG tablet Take 0.5 mg by mouth 2 (two) times daily as needed for anxiety.   08/19/2020  . amitriptyline (ELAVIL) 50 MG tablet Take 50 mg by mouth 3 (three) times a week.  0 11/08/2020  . Ascorbic Acid (VITAMIN C) 1000 MG tablet Take 1,000 mg by mouth 2 (two) times daily.   11/06/2020  . calcium carbonate (OSCAL) 1500 (600 Ca) MG TABS tablet Take 1,500 mg by mouth daily with breakfast.   11/06/2020  . clindamycin (CLEOCIN-T) 1 % lotion Apply to the face QAM (Patient taking differently: Apply 1 application topically daily as needed (Face).) 60 mL 1 11/06/2020  . diltiazem (CARDIZEM CD) 360 MG 24 hr capsule Take 360 mg by mouth daily.  0 11/10/2020 at Unknown time  . Docusate Sodium (DSS) 100 MG CAPS Take 100 mg by mouth daily.   11/06/2020  . famciclovir (FAMVIR) 500 MG tablet Take 500 mg by mouth daily as needed (Cold Sores).   11/06/2020  . folic acid (FOLVITE) 1 MG tablet Take 2 mg by mouth daily.  0 11/06/2020  . hydrocortisone 2.5 % cream Apply 1 application topically daily as needed (face irritation).   11/06/2020  . Magnesium Citrate 125 MG CAPS Take 125 mg by mouth daily.   11/06/2020  . meloxicam (MOBIC) 15 MG tablet Take 15 mg by mouth daily as needed (arthritis).  0 Past Month at Unknown time  . mometasone (ELOCON) 0.1 % ointment Apply to the face QHS x 2 weeks then decrease to 5d/wk. (Patient taking differently: Apply 1 application topically daily as needed (Apply to the face).) 45 g 0 11/06/2020  . Multiple Vitamins-Minerals (CENTRUM SILVER PO) Take 1 tablet by mouth daily.   Past Week at Unknown time  . naproxen sodium (ALEVE) 220 MG tablet Take 220 mg by mouth 2 (two) times  daily.   Past Week at Unknown time  . Omega-3 Fatty Acids (FISH OIL PO) Take 2,000 mg by mouth daily.   11/06/2020  . polyethylene glycol powder (GLYCOLAX/MIRALAX) 17 GM/SCOOP powder Take 17 g by mouth 2 (two) times daily as needed for moderate constipation. 255 g 0 11/06/2020  . potassium gluconate 595 (99 K) MG TABS tablet Take 595 mg by mouth 2 (two) times daily.   11/06/2020  . Red Yeast Rice Extract (RED YEAST RICE PO) Take 2,400 mg by mouth daily.   11/06/2020  . Saw Palmetto-Phytosterols (PROSTATE SR PO) Take 2 tablets by mouth daily.   11/06/2020  . sildenafil (VIAGRA) 50 MG tablet Take 50 mg by mouth daily as needed for erectile dysfunction.   11/06/2020  . sodium fluoride (FLUORISHIELD) 1.1 % GEL dental gel Place 1 application onto teeth 2 (two) times daily.   11/06/2020  . triamcinolone cream (KENALOG) 0.1 % Apply 1 application topically daily as needed (ECzema).   11/06/2020  . XELJANZ XR 11 MG TB24 Take 11 mg by mouth daily.   Past Week at Unknown time  . zolpidem (AMBIEN) 10 MG tablet Take 10 mg by mouth at bedtime as needed for sleep.   11/06/2020   Allergies  Allergen Reactions  . Adhesive [Tape] Hives  .  Polysporin [Bacitracin-Polymyxin B] Hives  . Shellfish Allergy Other (See Comments)    Stomach pain Scallpos  . Neosporin [Neomycin-Bacitracin Zn-Polymyx] Rash   Past Medical History:  Diagnosis Date  . Arthritis    Rheumatiod  . Dysplastic nevus 03/06/2020   Left flank at the inf. side. Moderate atypia, peripheral and deep margins involved.   . Family history of colonic polyps   . Hx of atypical nevus 09/10/2010   L ant med deltoid  . Hx of basal cell carcinoma 09/21/2004   L superior lateral deltoid  . Hx of basal cell carcinoma 09/03/2020   L mid to upper back paraspinal, [facility] 11/03/20  . Hx of dysplastic nevus 09/06/2007   L lat abd/side, moderate atypia  . Hx of dysplastic nevus 10/30/2008   R low back paraspinal,moderate to marked atypia  . Hx of dysplastic nevus  09/16/2008   L post deltoid, moderate atypia  . Hx of dysplastic nevus 03/31/2009   R mid back, 7.0cm lat to spine, moderate atypia  . Hx of dysplastic nevus 10/07/2009   R upper back 2.0cm lat to spine, mild atypia  . Hx of dysplastic nevus 10/07/2009   L upper back, 3.0cm lat to spine, moderate atypia  . Hx of dysplastic nevus 01/27/2010   R xiphoid, mild atypia  . Hx of dysplastic nevus 94/76/5465   L umbilicus at 4 o'clock, moderate atypia  . Hx of dysplastic nevus 02/11/2010   LLQA above exc scar, mod to severe atypia  . Hx of dysplastic nevus 01/27/2010   L low back 4.0cm lat to spine, moderate atypia  . Hx of dysplastic nevus 01/27/2010   L med distal thigh, mild atypia  . Hx of dysplastic nevus 05/05/2010   R distal lat deltoid, mild to mod atypia  . Hx of dysplastic nevus 05/05/2010   R costal margin, 4.0cm infer to R pectoral, mild to mod atypia  . Hx of dysplastic nevus 05/05/2010   R lat ant heel, mod to severe atypia  . Hx of dysplastic nevus 05/05/2010   R low back lat, 7.0cm lat to spine, moderate atypia  . Hx of dysplastic nevus 05/05/2010   low back medial, 4.5cm lat to spin, mild to mod atypia  . Hx of dysplastic nevus 06/06/2010   R low back lat flank, 8.0cm lat to spine, mild atypia  . Hx of dysplastic nevus 06/06/2010   L mid back, 8.0cm lat to spine, mild atypia  . Hx of dysplastic nevus 06/06/2010   R lat abdomen near side, 03TW lat to umbilicua, mild atypia  . Hx of dysplastic nevus 06/06/2010   R epigastric inderior, mild atypia  . Hx of dysplastic nevus 06/06/2010   R epigastric sup lateral, mild atypia  . Hx of dysplastic nevus 06/17/2010   L xyphoid, mild atypia  . Hx of dysplastic nevus 06/17/2010   R distal lat tricep, mild atypia  . Hx of dysplastic nevus 08/20/2010   LUQQ, moderate atypia  . Hx of dysplastic nevus 09/10/2010   R lat neck, moderate to severe  . Hx of dysplastic nevus 08/20/2010   L low back lat, 14.0cm lat to spine, mild  to moderate atypia  . Hx of dysplastic nevus 09/10/2010   R upper back paraspinal, mild to moderate atypia  . Hx of dysplastic nevus 09/10/2010   L buttock lateral, mild atypia  . Hx of dysplastic nevus 09/10/2010   R med ankle at super post inner aspect of the med malleolus,  mild to mod atypia  . Hx of dysplastic nevus 05/21/2014   L sup medial buttock,mild atypia  . Hx of dysplastic nevus 05/21/2014   R mid lat bicep, moderate atypia  . Hx of dysplastic nevus 10/03/2014   R medial infrapectoral, moderate to severe atypia, Edmonia James 11/04/2014  . Hx of dysplastic nevus 10/03/2014   Inferior to xyphoid epigastric, moderate atypia  . Hx of dysplastic nevus 10/03/2014   L medial costal area, moderate atypia  . Hx of dysplastic nevus 07/15/2015   R proximal lateral tricep, mild atypia  . Hx of dysplastic nevus 07/15/2015   R low back lat post flank med, mild atypia  . Hx of dysplastic nevus 07/15/2015   R low back post lat flank lat, moderate atypia  . Hx of dysplastic nevus 07/15/2015   R low back lat pos flank inf, mild atypia  . Hx of dysplastic nevus 12/16/2015   L sup med edge of areola, mild atypia  . Hx of dysplastic nevus 08/23/2019   R mid back 7.0cm lat to spine, mod to severe atypia, shave removal 09/12/19  . Hx of dysplastic nevus 08/23/2019   R mid back 8.5cm lat to spine, moderate atypia  . Hypertension 2008  . Osteoporosis   . Prostatitis    Past Surgical History:  Procedure Laterality Date  . COLONOSCOPY  09/11/2010  . COLONOSCOPY WITH PROPOFOL N/A 02/04/2016   Procedure: COLONOSCOPY WITH PROPOFOL;  Surgeon: [name], MD;  Location: [facility] ENDOSCOPY;  Service: Endoscopy;  Laterality: N/A;  . SKIN LESION EXCISION  2011   Social History   Socioeconomic History  . Marital status: Married    Spouse name: Not on file  . Number of children: Not on file  . Years of education: Not on file  . Highest education level: Not on file  Occupational History  . Not  on file  Tobacco Use  . Smoking status: Never Smoker  . Smokeless tobacco: Never Used  Vaping Use  . Vaping Use: Never used  Substance and Sexual Activity  . Alcohol use: Yes    Alcohol/week: 1.0 standard drink    Types: 1 Cans of beer per week  . Drug use: No  . Sexual activity: Not on file  Other Topics Concern  . Not on file  Social History Narrative  . Not on file   Social Determinants of Health   Financial Resource Strain: Not on file  Food Insecurity: Not on file  Transportation Needs: Not on file  Physical Activity: Not on file  Stress: Not on file  Social Connections: Not on file  Intimate Partner Violence: Not on file   Social History   Social History Narrative  . Not on file     ROS: Negative.     PE: HEENT: Negative. Lungs: Clear. Cardio: RR.  Assessment/Plan:  Left inguinal hernia repair with prosthetic mesh.    Forest Gleason Wai Minotti 11/10/2020   Assessment/Plan:  Left inguinal hernia repair with prosthetic mesh.     Proceed with planned endoscopy.

## 2020-11-10 NOTE — Op Note (Signed)
Preoperative diagnosis: Symptomatic left inguinal hernia.  Postoperative diagnosis: 1) direct inguinal hernia; 2) lipoma the cord.  Operative procedure: Repair of left direct inguinal hernia with medium Ultra Pro mesh.  Operating surgeon: Hervey Ard, MD.  Anesthesia: General by LMA, Marcaine 0.5% with 1: 200,000 units of epinephrine, Toradol: 30 mg.  Estimated blood loss: Less than 2 cc.  Clinical note: This 65 year old male developed symptomatic left inguinal hernia.  He had had a prior CT suggesting fat within the left inguinal canal.  Clinical exam was consistent with a left inguinal hernia.  He was admitted for elective repair.  He received Ancef prior to the procedure.  SCD stockings for DVT prevention.  Care was removed with clippers prior to presentation to the operating theater.  Operative note: With the patient under adequate general anesthesia the area was cleansed with ChloraPrep and draped.  Local field block anesthesia was established for postoperative analgesia.  A 5 cm skin line incision along the anticipated course the inguinal canal was made sharply.  The remaining dissection completed with electrocautery.  The external oblique muscle was opened in the direction of its fibers.  The undersurface was cleared proximally and distally.  The ilioinguinal and iliohypogastric nerves were identified and protected.  The cord was mobilized.  Dissection at the level internal ring showed a small to moderate sized lipoma of the cord.  This was removed with hemostasis by 3-0 Vicryl suture.  Dissection failed to show a significant indirect sac.  Medially there was general weakness of the posterior canal and a 6-7 mm defect which would just admit the tip of the index finger.  This was felt to be the source of his symptomatic discomfort and the clinical bulge.  This area was exposed and the preperitoneal space entered.  After this was cleared circumferentially a medium Ultra Pro mesh was  smoothed into this location.  The external component was laid along the floor the inguinal canal.  This was anchored to the pubic tubercle with 0 Surgilon sutures x2 and then along the inguinal ligament with interrupted 0 Surgilon sutures.  The medial and superior aspects were anchored to the transverse abdominis aponeurosis.  A lateral slit was made for cord passage and closed.  The ilioinguinal and iliohypogastric nerves were returned to their bed.  Toradol was placed in the wound.  The external oblique muscle was closed with a running 2-0 Vicryl suture.  Scarpa's fascia was closed with a running 3-0 Vicryl suture.  The skin was closed with a running 4-0 Vicryl subcuticular suture.  Benzoin, Steri-Strips, Telfa and Tegaderm dressing applied.  The patient tolerated the procedure well and was taken recovery room in stable condition.

## 2020-11-10 NOTE — Transfer of Care (Signed)
Immediate Anesthesia Transfer of Care Note  Patient: Marcus Malone  Procedure(s) Performed: HERNIA REPAIR INGUINAL ADULT WITH MESH (Left Groin)  Patient Location: PACU  Anesthesia Type:General  Level of Consciousness: sedated  Airway & Oxygen Therapy: Patient connected to face mask oxygen  Post-op Assessment: Post -op Vital signs reviewed and stable  Post vital signs: stable  Last Vitals:  Vitals Value Taken Time  BP 104/61 11/10/20 1515  Temp    Pulse 67 11/10/20 1518  Resp 11 11/10/20 1518  SpO2 97 % 11/10/20 1518  Vitals shown include unvalidated device data.  Last Pain:  Vitals:   11/10/20 1310  TempSrc: Tympanic  PainSc: 0-No pain         Complications: No complications documented.

## 2020-11-10 NOTE — Anesthesia Procedure Notes (Signed)
Procedure Name: LMA Insertion Date/Time: 11/10/2020 2:22 PM Performed by: Gentry Fitz, CRNA Pre-anesthesia Checklist: Patient identified, Emergency Drugs available, Suction available and Patient being monitored Patient Re-evaluated:Patient Re-evaluated prior to induction Oxygen Delivery Method: Circle system utilized Preoxygenation: Pre-oxygenation with 100% oxygen Induction Type: IV induction Ventilation: Mask ventilation without difficulty LMA Size: 4.0 Number of attempts: 2 Tube secured with: Tape Dental Injury: Teeth and Oropharynx as per pre-operative assessment

## 2020-11-11 ENCOUNTER — Encounter: Payer: Self-pay | Admitting: General Surgery

## 2020-11-11 NOTE — Anesthesia Postprocedure Evaluation (Signed)
Anesthesia Post Note  Patient: Marcus Malone  Procedure(s) Performed: HERNIA REPAIR INGUINAL ADULT WITH MESH (Left Groin)  Patient location during evaluation: PACU Anesthesia Type: General Level of consciousness: awake and alert Pain management: pain level controlled Vital Signs Assessment: post-procedure vital signs reviewed and stable Respiratory status: spontaneous breathing, nonlabored ventilation and respiratory function stable Cardiovascular status: blood pressure returned to baseline and stable Postop Assessment: no apparent nausea or vomiting Anesthetic complications: no   No complications documented.   Last Vitals:  Vitals:   11/10/20 1600 11/10/20 1640  BP: 130/87 (!) 152/89  Pulse: 68 67  Resp: 11 14  Temp:  36.7 C  SpO2: 98% 97%    Last Pain:  Vitals:   11/10/20 1640  TempSrc:   PainSc: [place]

## 2021-01-21 ENCOUNTER — Other Ambulatory Visit: Payer: Self-pay

## 2021-01-21 ENCOUNTER — Encounter: Payer: Self-pay | Admitting: Dermatology

## 2021-01-21 ENCOUNTER — Ambulatory Visit: Payer: BC Managed Care – PPO | Admitting: Dermatology

## 2021-01-21 DIAGNOSIS — L578 Other skin changes due to chronic exposure to nonionizing radiation: Secondary | ICD-10-CM

## 2021-01-21 DIAGNOSIS — Z1283 Encounter for screening for malignant neoplasm of skin: Secondary | ICD-10-CM | POA: Diagnosis not present

## 2021-01-21 DIAGNOSIS — Z86018 Personal history of other benign neoplasm: Secondary | ICD-10-CM

## 2021-01-21 DIAGNOSIS — D229 Melanocytic nevi, unspecified: Secondary | ICD-10-CM

## 2021-01-21 DIAGNOSIS — L821 Other seborrheic keratosis: Secondary | ICD-10-CM

## 2021-01-21 DIAGNOSIS — L820 Inflamed seborrheic keratosis: Secondary | ICD-10-CM | POA: Diagnosis not present

## 2021-01-21 DIAGNOSIS — Z85828 Personal history of other malignant neoplasm of skin: Secondary | ICD-10-CM | POA: Diagnosis not present

## 2021-01-21 DIAGNOSIS — L570 Actinic keratosis: Secondary | ICD-10-CM | POA: Diagnosis not present

## 2021-01-21 DIAGNOSIS — L814 Other melanin hyperpigmentation: Secondary | ICD-10-CM

## 2021-01-21 DIAGNOSIS — D1800 Hemangioma unspecified site: Secondary | ICD-10-CM

## 2021-01-21 NOTE — Progress Notes (Signed)
Follow-Up Visit   Subjective  Marcus Malone is a 65 y.o. male who presents for the following: TBSE (6 mo total body exam. Hx of BCC and multiple dysplastic nevi. Pt has spots on his left temple and left forehead that has been bothering him. He states that the spot on his left temple has grown. Pt also complains of what he believes is a skin tag on his right neck. He states that it gets caught on his shirts and itches. ). Patient here for full body skin exam and skin cancer screening.  The following portions of the chart were reviewed this encounter and updated as appropriate:  Tobacco  Allergies  Meds  Problems  Med Hx  Surg Hx  Fam Hx      Review of Systems: No other skin or systemic complaints except as noted in HPI or Assessment and Plan.   Objective  Well appearing patient in no apparent distress; mood and affect are within normal limits.  A full examination was performed including scalp, head, eyes, ears, nose, lips, neck, chest, axillae, abdomen, back, buttocks, bilateral upper extremities, bilateral lower extremities, hands, feet, fingers, toes, fingernails, and toenails. All findings within normal limits unless otherwise noted below.  Objective  Left sideburn x 1, Left lateral forehead x 1, right neck x 1 (3): Erythematous keratotic or waxy stuck-on papule or plaque.   Objective  right forehead x 3 (3): Erythematous thin papules/macules with gritty scale.   Assessment & Plan  Inflamed seborrheic keratosis (3) Left sideburn x 1, Left lateral forehead x 1, right neck x 1 Prior to procedure, discussed risks of blister formation, small wound, skin dyspigmentation, or rare scar following cryotherapy.   Destruction of lesion - Left sideburn x 1, Left lateral forehead x 1, right neck x 1 Complexity: simple   Destruction method: cryotherapy   Informed consent: discussed and consent obtained   Timeout:  patient name, date of birth, surgical site, and procedure  verified Lesion destroyed using liquid nitrogen: Yes   Region frozen until ice ball extended beyond lesion: Yes   Outcome: patient tolerated procedure well with no complications   Post-procedure details: wound care instructions given    AK (actinic keratosis) (3) right forehead x 3 Actinic keratoses are precancerous spots that appear secondary to cumulative UV radiation exposure/sun exposure over time. They are chronic with expected duration over 1 year. A portion of actinic keratoses will progress to squamous cell carcinoma of the skin. It is not possible to reliably predict which spots will progress to skin cancer and so treatment is recommended to prevent development of skin cancer.  Recommend daily broad spectrum sunscreen SPF 30+ to sun-exposed areas, reapply every 2 hours as needed.  Recommend staying in the shade or wearing long sleeves, sun glasses (UVA+UVB protection) and wide brim hats (4-inch brim around the entire circumference of the hat). Call for new or changing lesions.  Prior to procedure, discussed risks of blister formation, small wound, skin dyspigmentation, or rare scar following cryotherapy.   Destruction of lesion - right forehead x 3 Complexity: simple   Destruction method: cryotherapy   Informed consent: discussed and consent obtained   Timeout:  patient name, date of birth, surgical site, and procedure verified Lesion destroyed using liquid nitrogen: Yes   Region frozen until ice ball extended beyond lesion: Yes   Outcome: patient tolerated procedure well with no complications   Post-procedure details: wound care instructions given    Lentigines - Scattered tan macules -  Due to sun exposure - Benign-appering, observe - Recommend daily broad spectrum sunscreen SPF 30+ to sun-exposed areas, reapply every 2 hours as needed. - Call for any changes  Seborrheic Keratoses - Stuck-on, waxy, tan-brown papules and/or plaques  - Benign-appearing - Discussed benign  etiology and prognosis. - Observe - Call for any changes  Melanocytic Nevi - Tan-brown and/or pink-flesh-colored symmetric macules and papules - Benign appearing on exam today - Observation - Call clinic for new or changing moles - Recommend daily use of broad spectrum spf 30+ sunscreen to sun-exposed areas.   Hemangiomas - Red papules - Discussed benign nature - Observe - Call for any changes  Actinic Damage - Chronic condition, secondary to cumulative UV/sun exposure - diffuse scaly erythematous macules with underlying dyspigmentation - Recommend daily broad spectrum sunscreen SPF 30+ to sun-exposed areas, reapply every 2 hours as needed.  - Staying in the shade or wearing long sleeves, sun glasses (UVA+UVB protection) and wide brim hats (4-inch brim around the entire circumference of the hat) are also recommended for sun protection.  - Call for new or changing lesions.  Skin cancer screening performed today.  History of Basal Cell Carcinoma of the Skin - No evidence of recurrence today - Recommend regular full body skin exams - Recommend daily broad spectrum sunscreen SPF 30+ to sun-exposed areas, reapply every 2 hours as needed.  - Call if any new or changing lesions are noted between office visits  History of Dysplastic Nevi - No evidence of recurrence today - Recommend regular full body skin exams - Recommend daily broad spectrum sunscreen SPF 30+ to sun-exposed areas, reapply every 2 hours as needed.  - Call if any new or changing lesions are noted between office visits  Return in about 6 months (around 07/24/2021) for TSBE.   IHarriett Sine, CMA, am acting as scribe for Sarina Ser, MD.  Documentation: I have reviewed the above documentation for accuracy and completeness, and I agree with the above.  Sarina Ser, MD

## 2021-01-21 NOTE — Patient Instructions (Signed)
Cryotherapy Aftercare  . Wash gently with soap and water everyday.   . Apply Vaseline and Band-Aid daily until healed.  

## 2021-01-23 ENCOUNTER — Encounter: Payer: Self-pay | Admitting: Dermatology

## 2021-02-27 ENCOUNTER — Ambulatory Visit: Payer: BC Managed Care – PPO | Admitting: Dermatology

## 2021-02-27 ENCOUNTER — Other Ambulatory Visit: Payer: Self-pay

## 2021-02-27 DIAGNOSIS — L820 Inflamed seborrheic keratosis: Secondary | ICD-10-CM | POA: Diagnosis not present

## 2021-02-27 DIAGNOSIS — R21 Rash and other nonspecific skin eruption: Secondary | ICD-10-CM

## 2021-02-27 NOTE — Progress Notes (Signed)
   Follow-Up Visit   Subjective  Marcus Malone is a 65 y.o. male who presents for the following: Skin Problem (Patient here today for a few spots. Patient advises about 2 weeks ago his neck and chest became red, inflamed and very itchy. He uses otc HC cream that helped but now there is a spot remaining at left lower neck that is still red and crusty. ).  Patient also has a spot at left post shoulder.  He also has a spot on the chest Patient did start gabapentin about 6 weeks ago. He does have an allergy to grass but because of his RA he does not do any yard work. Patient does play outside with his son.   The following portions of the chart were reviewed this encounter and updated as appropriate:   Tobacco  Allergies  Meds  Problems  Med Hx  Surg Hx  Fam Hx      Review of Systems:  No other skin or systemic complaints except as noted in HPI or Assessment and Plan.  Objective  Well appearing patient in no apparent distress; mood and affect are within normal limits.  A focused examination was performed including face, neck, chest and back. Relevant physical exam findings are noted in the Assessment and Plan.  Neck Linear erythema at left neck and right posterior axillary area  Left of midline supra sternal notch Erythematous keratotic or waxy stuck-on papule or plaque.    Assessment & Plan  Rash -most consistent with contact dermatitis Neck Most consistent with allergic contact dermatitis  Start mometasone cream twice a day for up to 2 weeks. Avoid applying to face, groin, and axilla. Use as directed. Risk of skin atrophy with long-term use reviewed.   Patient has mometasone and TMC 0.1%. Advised him he can use either one twice a day for up to 2 weeks.   Topical steroids (such as triamcinolone, fluocinolone, fluocinonide, mometasone, clobetasol, halobetasol, betamethasone, hydrocortisone) can cause thinning and lightening of the skin if they are used for too long in the  same area. Your physician has selected the right strength medicine for your problem and area affected on the body. Please use your medication only as directed by your physician to prevent side effects.    Inflamed seborrheic keratosis Left of midline supra sternal notch Recheck at follow up  Destruction of lesion - Left of midline supra sternal notch Complexity: simple   Destruction method: cryotherapy   Informed consent: discussed and consent obtained   Timeout:  patient name, date of birth, surgical site, and procedure verified Lesion destroyed using liquid nitrogen: Yes   Region frozen until ice ball extended beyond lesion: Yes   Outcome: patient tolerated procedure well with no complications   Post-procedure details: wound care instructions given    Return for as scheduled.  Graciella Belton, RMA, am acting as scribe for Sarina Ser, MD . Documentation: I have reviewed the above documentation for accuracy and completeness, and I agree with the above.  Sarina Ser, MD

## 2021-02-27 NOTE — Patient Instructions (Addendum)
Start mometasone or triamcinolone cream twice a day for up to 2 weeks. Avoid applying to face, groin, and axilla. Use as directed. Risk of skin atrophy with long-term use reviewed.   Topical steroids (such as triamcinolone, fluocinolone, fluocinonide, mometasone, clobetasol, halobetasol, betamethasone, hydrocortisone) can cause thinning and lightening of the skin if they are used for too long in the same area. Your physician has selected the right strength medicine for your problem and area affected on the body. Please use your medication only as directed by your physician to prevent side effects.   If you have any questions or concerns for your doctor, please call our main line at [phone] and press option 4 to reach your doctor's medical assistant. If no one answers, please leave a voicemail as directed and we will return your call as soon as possible. Messages left after 4 pm will be answered the following business day.   You may also send [place] a message via [facility]. We typically respond to MyChart messages within 1-2 business days.  For prescription refills, please ask your pharmacy to contact our office. Our fax number is [phone].  If you have an urgent issue when the clinic is closed that cannot wait until the next business day, you can page your doctor at the number below.    Please note that while we do our best to be available for urgent issues outside of office hours, we are not available 24/7.   If you have an urgent issue and are unable to reach [place], you may choose to seek medical care at your doctor's office, retail clinic, urgent care center, or emergency room.  If you have a medical emergency, please immediately call 911 or go to the emergency department.  Pager Numbers  - Dr. Nehemiah Massed: [phone]  - Dr. Laurence Ferrari: [phone]  - Dr. Nicole Kindred: [phone]  In the event of inclement weather, please call our main line at [phone] for an update on the status of any  delays or closures.  Dermatology Medication Tips: Please keep the boxes that topical medications come in in order to help keep track of the instructions about where and how to use these. Pharmacies typically print the medication instructions only on the boxes and not directly on the medication tubes.   If your medication is too expensive, please contact our office at [phone] option 4 or send [place] a message through [facility].   We are unable to tell what your co-pay for medications will be in advance as this is different depending on your insurance coverage. However, we may be able to find a substitute medication at lower cost or fill out paperwork to get insurance to cover a needed medication.   If a prior authorization is required to get your medication covered by your insurance company, please allow [place] 1-2 business days to complete this process.  Drug prices often vary depending on where the prescription is filled and some pharmacies may offer cheaper prices.  The website www.goodrx.com contains coupons for medications through different pharmacies. The prices here do not account for what the cost may be with help from insurance (it may be cheaper with your insurance), but the website can give you the price if you did not use any insurance.  - You can print the associated coupon and take it with your prescription to the pharmacy.  - You may also stop by our office during regular business hours and pick up a GoodRx coupon card.  - If you need your  prescription sent electronically to a different pharmacy, notify our office through [facility] or by phone at [phone] option 4.

## 2021-03-03 ENCOUNTER — Encounter: Payer: Self-pay | Admitting: Dermatology

## 2021-07-31 ENCOUNTER — Other Ambulatory Visit: Payer: Self-pay | Admitting: Internal Medicine

## 2021-07-31 ENCOUNTER — Ambulatory Visit
Admission: RE | Admit: 2021-07-31 | Discharge: 2021-07-31 | Disposition: A | Discharge status: Home / Self Care | Payer: BC Managed Care – PPO | Attending: Internal Medicine | Admitting: Internal Medicine

## 2021-07-31 ENCOUNTER — Ambulatory Visit
Admission: RE | Admit: 2021-07-31 | Discharge: 2021-07-31 | Disposition: A | Discharge status: Home / Self Care | Payer: BC Managed Care – PPO | Source: Ambulatory Visit | Attending: Internal Medicine | Admitting: Internal Medicine

## 2021-07-31 DIAGNOSIS — M549 Dorsalgia, unspecified: Secondary | ICD-10-CM | POA: Diagnosis not present

## 2021-08-03 ENCOUNTER — Other Ambulatory Visit: Payer: Self-pay

## 2021-08-03 ENCOUNTER — Ambulatory Visit: Payer: BC Managed Care – PPO | Admitting: Dermatology

## 2021-08-03 DIAGNOSIS — L719 Rosacea, unspecified: Secondary | ICD-10-CM | POA: Diagnosis not present

## 2021-08-03 DIAGNOSIS — L570 Actinic keratosis: Secondary | ICD-10-CM

## 2021-08-03 DIAGNOSIS — L578 Other skin changes due to chronic exposure to nonionizing radiation: Secondary | ICD-10-CM

## 2021-08-03 DIAGNOSIS — L820 Inflamed seborrheic keratosis: Secondary | ICD-10-CM

## 2021-08-03 DIAGNOSIS — D1800 Hemangioma unspecified site: Secondary | ICD-10-CM

## 2021-08-03 DIAGNOSIS — Z1283 Encounter for screening for malignant neoplasm of skin: Secondary | ICD-10-CM

## 2021-08-03 DIAGNOSIS — Z85828 Personal history of other malignant neoplasm of skin: Secondary | ICD-10-CM | POA: Diagnosis not present

## 2021-08-03 DIAGNOSIS — L814 Other melanin hyperpigmentation: Secondary | ICD-10-CM

## 2021-08-03 DIAGNOSIS — L821 Other seborrheic keratosis: Secondary | ICD-10-CM

## 2021-08-03 DIAGNOSIS — D229 Melanocytic nevi, unspecified: Secondary | ICD-10-CM

## 2021-08-03 DIAGNOSIS — Z86018 Personal history of other benign neoplasm: Secondary | ICD-10-CM

## 2021-08-03 NOTE — Progress Notes (Signed)
Follow-Up Visit   Subjective  Marcus Malone is a 65 y.o. male who presents for the following: Annual Exam (Hx BCC, dysplastic nevi - patient has persistent lesions on the forehead he would like re-treated today.). The patient presents for Total-Body Skin Exam (TBSE) for skin cancer screening and mole check.  The following portions of the chart were reviewed this encounter and updated as appropriate:   Tobacco  Allergies  Meds  Problems  Med Hx  Surg Hx  Fam Hx     Review of Systems:  No other skin or systemic complaints except as noted in HPI or Assessment and Plan.  Objective  Well appearing patient in no apparent distress; mood and affect are within normal limits.  A full examination was performed including scalp, head, eyes, ears, nose, lips, neck, chest, axillae, abdomen, back, buttocks, bilateral upper extremities, bilateral lower extremities, hands, feet, fingers, toes, fingernails, and toenails. All findings within normal limits unless otherwise noted below.  Temples x 2 (2) Erythematous keratotic or waxy stuck-on papule or plaque.   L temple x 1 Erythematous thin papules/macules with gritty scale.   Face Mid face erythema with telangiectasias +/- scattered inflammatory papules.    Assessment & Plan  Inflamed seborrheic keratosis Temples x 2  Destruction of lesion - Temples x 2 Complexity: simple   Destruction method: cryotherapy   Informed consent: discussed and consent obtained   Timeout:  patient name, date of birth, surgical site, and procedure verified Lesion destroyed using liquid nitrogen: Yes   Region frozen until ice ball extended beyond lesion: Yes   Outcome: patient tolerated procedure well with no complications   Post-procedure details: wound care instructions given    AK (actinic keratosis) L temple x 1  Destruction of lesion - L temple x 1 Complexity: simple   Destruction method: cryotherapy   Informed consent: discussed and consent  obtained   Timeout:  patient name, date of birth, surgical site, and procedure verified Lesion destroyed using liquid nitrogen: Yes   Region frozen until ice ball extended beyond lesion: Yes   Outcome: patient tolerated procedure well with no complications   Post-procedure details: wound care instructions given    Rosacea Face No tx needed today, doing well.   Skin cancer screening  Lentigines - Scattered tan macules - Due to sun exposure - Benign-appearing, observe - Recommend daily broad spectrum sunscreen SPF 30+ to sun-exposed areas, reapply every 2 hours as needed. - Call for any changes  Seborrheic Keratoses - Stuck-on, waxy, tan-brown papules and/or plaques  - Benign-appearing - Discussed benign etiology and prognosis. - Observe - Call for any changes  Melanocytic Nevi - Tan-brown and/or pink-flesh-colored symmetric macules and papules - Benign appearing on exam today - Observation - Call clinic for new or changing moles - Recommend daily use of broad spectrum spf 30+ sunscreen to sun-exposed areas.   Hemangiomas - Red papules - Discussed benign nature - Observe - Call for any changes  Actinic Damage - Chronic condition, secondary to cumulative UV/sun exposure - diffuse scaly erythematous macules with underlying dyspigmentation - Recommend daily broad spectrum sunscreen SPF 30+ to sun-exposed areas, reapply every 2 hours as needed.  - Staying in the shade or wearing long sleeves, sun glasses (UVA+UVB protection) and wide brim hats (4-inch brim around the entire circumference of the hat) are also recommended for sun protection.  - Call for new or changing lesions.  History of Basal Cell Carcinoma of the Skin - No evidence of recurrence  today - Recommend regular full body skin exams - Recommend daily broad spectrum sunscreen SPF 30+ to sun-exposed areas, reapply every 2 hours as needed.  - Call if any new or changing lesions are noted between office  visits  History of Dysplastic Nevi - No evidence of recurrence today - Recommend regular full body skin exams - Recommend daily broad spectrum sunscreen SPF 30+ to sun-exposed areas, reapply every 2 hours as needed.  - Call if any new or changing lesions are noted between office visits  Skin cancer screening performed today.  Return in about 6 months (around 01/31/2022) for TBSE.  Luther Redo, CMA, am acting as scribe for Sarina Ser, MD . Documentation: I have reviewed the above documentation for accuracy and completeness, and I agree with the above.  Sarina Ser, MD

## 2021-08-03 NOTE — Patient Instructions (Signed)

## 2021-08-09 ENCOUNTER — Encounter: Payer: Self-pay | Admitting: Dermatology

## 2021-08-21 ENCOUNTER — Other Ambulatory Visit: Payer: Self-pay | Admitting: General Surgery

## 2021-08-21 DIAGNOSIS — R1032 Left lower quadrant pain: Secondary | ICD-10-CM

## 2021-10-25 NOTE — Progress Notes (Signed)
Patient: Marcus Malone  Service Category: E/M  Provider: Gaspar Cola, MD  DOB: 21-Oct-1955  DOS: 10/28/2021  Referring Provider: [name], MD  MRN: [id number]  Setting: Ambulatory outpatient  PCP: Albina Billet, MD  Type: New Patient  Specialty: Interventional Pain Management    Location: Office  Delivery: Face-to-face     Primary Reason(s) for Visit: Encounter for initial evaluation of one or more chronic problems (new to examiner) potentially causing chronic pain, and posing a threat to normal musculoskeletal function. (Level of risk: High) CC: Other (Groin pain left side s/p hernia repair 1 year ago. ), Back Pain (Lumbar left ), and Knee Pain (Right )  HPI  Marcus Malone is a 66 y.o. year old, male patient, who comes for the first time to our practice referred by [name], MD for our initial evaluation of his chronic pain. He has Family history of colon cancer; Chest pain, atypical; Heart murmur; Hypertension; PVC's (premature ventricular contractions); Chronic pain syndrome; Pharmacologic therapy; Disorder of skeletal system; Problems influencing health status; Chronic pain of inguinal region (Left); Chronic groin pain (1ry area of Pain) (Left); Chronic postoperative pain (inguinal region) (Left); Age-related osteoporosis without current pathological fracture; Osteoarthritis; Other long term (current) drug therapy; Hand joint pain; Pain in wrist; Rheumatoid arthritis ([place]); Chronic low back pain (2ry area of Pain) (Left) w/o sciatica; Chronic knee pain (3ry area of Pain) (Right); History of claustrophobia; Generalized anxiety disorder; 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 Today he comes in for evaluation of his Other (Groin pain left side s/p hernia repair 1 year ago. ), Back Pain (Lumbar left ), and Knee Pain (Right )  Pain Assessment: Location: Left Groin (see visit info for additional pain sites.) Radiating:  back pain radiates up the back.  the groin pain causes pain on different sides of the incision, can be very deep pain. today hurting right in the crease Onset: More than a month ago Duration: Chronic pain Quality: Discomfort, Constant, Burning, Aching, Sharp, Dull, Numbness (knee, was told this was a small meniscus tare per rheumatologist) Severity: 3 /10 (subjective, self-reported pain score)  Effect on ADL: when the back pain occurred he was sitting on the floor folding laundry and his wife had to assist him up to the couch where he pretty much stayed for the next few days. Timing: Constant Modifying factors: brace or ice for knee, medications BP: (!) 146/77   HR: 77  Onset and Duration: Date of onset: 11/2020 and Present longer than 3 months Cause of pain: Surgery Severity: Getting better, NAS-11 at its worse: 3/10, NAS-11 at its best: 0/10, NAS-11 now: 3/10, and NAS-11 on the average: 2/10 Timing: Afternoon and Night Aggravating Factors: Bending, Bowel movements, Intercourse (sex), Motion, Prolonged sitting, Squatting, Stooping , Walking, and Working Alleviating Factors: Lying down, Medications, Nerve blocks, and Sleeping Associated Problems: Depression, Personality changes, and Sadness Quality of Pain: Annoying, Burning, Intermittent, Nagging, Pressure-like, and Sharp Previous Examinations or Tests: X-rays Previous Treatments: Narcotic medications and Trigger point injections  According to the patient the primary area of pain is that of the left groin.  He states that the pain started approximately a year ago when after having undergone a left inguinal herniorrhaphy, he experienced numbness in the left groin area with pain that was quite significant and lasted for approximately 2-1/2 months after the surgery.  He went back to the surgeon who then proceeded to do an ultrasound-guided  inguinal nerve block using Xylocaine and steroid.  The patient refers that he had a total of 3 different  injections, and each one of them seem to work the same way.  According to the patient he does not get any type of numbness or relief of the pain immediately after the injection despite the fact that they all have Xylocaine.  He refers that 3 to 5 days after the injection that when he begins to experience the relief of the pain now although it does not go down to a pain level of 0, he gets enough relief for 2 to 3 months and then the pain returns.  The patient's secondary area of pain is that of the left lower back.  This particular pain actually started after the left groin pain and he refers this pain to be localized over the area of the left PSIS.  This pain started rather suddenly when he was working on the floor and when he attempted to get up, he experienced a severe sharp pain that would not let go.  He refers that he was approximately 2 hours later that he was able to begin moving.  He immediately called his primary care physician who gave him a steroid taper and order a 2 view x-ray of the lumbar spine.  This came back negative for any acute injuries.  He refers that this started on a Friday and it was not until Monday that he could actually get out of bed and move.  He called ED on Friday but they told him that most likely they were not going to do anything different than what his PCP had already done and therefore he did not go.  He refers that the steroid seems to have helped with time and at this point he still has pain but is not as intense as it was on the day that it happened.  Because the mechanism of this injury was so then and related to movement and positioning of his spine, today I will be ordering x-rays of his lumbar spine on flexion and extension to determine if there is any evidence of unstable spondylolisthesis.  The patient's third area pain is that of the right knee, which has begun to give him problems more recently.  He refers having gone to his rheumatologist who did an office  x-ray, which is not available to [place].  He refers that he was told that there did not seem to be any problems with the knee and that perhaps it could be a problem with the meniscus.  The patient denies ever having had any kind of knee surgery, physical therapy, or joint injections.  Pharmacotherapy: The patient indicates that he takes gabapentin 300 mg twice daily.  This does help with the groin pain and while he was taking 300 3 times daily he was doing a much better job, unfortunately, they gabapentin is having some side effects were he has very loose stools at 900 mg/day.  He has been able to tolerate to 600 mg/day and he has stayed on that.  He denies having done a trial of Lyrica and therefore today we will be instructing him to stop the gabapentin and to start Lyrica 75 mg p.o. daily and to increase it every 2 weeks x 1 more pill until he gets to 75 mg p.o. 3 times daily at which time he can be reevaluated for further increases, if he is tolerating the medication well.  In addition to the above he  takes some naproxen, but he refers not liking the use of opioid analgesics because of how it makes him feel.  The patient indicates that in the next couple months he will be moving to [facility] and for this reason I have entered a referral to Dr. Kari Baars for follow-up treatment of this patient once he moves to the [place] area.  The patient had a CT of the abdomen available that was done prior to his surgery, but no imaging after the surgery.  He also does not have any CTs or MRIs of the lumbar spine.  Unfortunately, the patient does have severe generalized anxiety disorder with claustrophobia as well as phobias to heights and open spaces.  He denies seeing a psychologist or a psychiatrist for this because he refers that he "handles them".  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  The patient refers that his family has already moved and he has nobody to drive  him around him for this reason, he cannot take any Valium prior to doing an MRI and he has requested that we do a CT scan, which he can tolerate without being medicated.  Today he has also requested that we bring him in as soon as possible for a left inguinal nerve block as he needs something that can help him with the pain during his trip to [place].  Today I took the time to provide the patient with information regarding my pain practice. The patient was informed that my practice is divided into two sections: an interventional pain management section, as well as a completely separate and distinct medication management section. I explained that I have procedure days for my interventional therapies, and evaluation days for follow-ups and medication management. Because of the amount of documentation required during both, they are kept separated. This means that there is the possibility that he may be scheduled for a procedure on one day, and medication management the next. I have also informed him that because of staffing and facility limitations, I no longer take patients for medication management only. To illustrate the reasons for this, I gave the patient the example of surgeons, and how inappropriate it would be to refer a patient to his/her care, just to write for the post-surgical antibiotics on a surgery done by a different surgeon.   Because interventional pain management is my board-certified specialty, the patient was informed that joining my practice means that they are open to any and all interventional therapies. I made it clear that this does not mean that they will be forced to have any procedures done. What this means is that I believe interventional therapies to be essential part of the diagnosis and proper management of chronic pain conditions. Therefore, patients not interested in these interventional alternatives will be better served under the care of a different practitioner.  The patient  was also made aware of my Comprehensive Pain Management Safety Guidelines where by joining my practice, they limit all of their nerve blocks and joint injections to those done by our practice, for as long as we are retained to manage their care.   Historic Controlled Substance Pharmacotherapy Review  PMP and historical list of controlled substances: Tramadol 50 mg tablet, 1 tab p.o. 3 times daily (last prescribed on 10/16/2021); alprazolam 0.5 mg tablet, 1 tab p.o. twice daily; oxycodone/APAP 5/325 (# 20 tablets), 1 tab p.o. every 4 hours; zolpidem (Ambien) 10 mg tablet 1 daily. Current opioid analgesics: Tramadol  50 mg tablet, 1 tab p.o. 3 times daily (last prescribed on 10/16/2021) MME/day: 15 mg/day  Historical Monitoring: The patient  reports no history of drug use. List of all UDS Test(s): No results found for: MDMA, COCAINSCRNUR, [place], [place], CANNABQUANT, THCU, [place] List of other Serum/Urine Drug Screening Test(s):  No results found for: AMPHSCRSER, BARBSCRSER, BENZOSCRSER, COCAINSCRSER, COCAINSCRNUR, PCPSCRSER, PCPQUANT, THCSCRSER, THCU, CANNABQUANT, OPIATESCRSER, OXYSCRSER, PROPOXSCRSER, ETH Historical Background Evaluation: [place] PMP: PDMP reviewed during this encounter. Online review of the past 88-monthperiod conducted.             PMP NARX Score Report:  Narcotic: 250 Sedative: 210 Stimulant: 000 [place] Department of public safety, offender search: (Editor, commissioningInformation) Non-contributory Risk Assessment Profile: Aberrant behavior: None observed or detected today Risk factors for fatal opioid overdose: None identified today PMP NARX Overdose Risk Score: 160 Fatal overdose hazard ratio (HR): Calculation deferred Non-fatal overdose hazard ratio (HR): Calculation deferred Risk of opioid abuse or dependence: 0.7-3.0% with doses ? 36 MME/day and 6.1-26% with doses ? 120 MME/day. Substance use disorder (SUD) risk level: See below Personal History of Substance Abuse (SUD-Substance use  disorder):  Alcohol: Negative  Illegal Drugs: Negative  Rx Drugs: Negative  ORT Risk Level calculation: Low Risk  Opioid Risk Tool - 10/28/21 1003       Family History of Substance Abuse   Alcohol Negative    Illegal Drugs Negative    Rx Drugs Negative      Personal History of Substance Abuse   Alcohol Negative    Illegal Drugs Negative    Rx Drugs Negative      Age   Age between 173-45years  No      Psychological Disease   Psychological Disease Positive    ADD Negative    OCD Negative    Bipolar Negative    Schizophrenia Negative    Depression Positive   anxiety     Total Score   Opioid Risk Tool Scoring 3    Opioid Risk Interpretation Low Risk            ORT Scoring interpretation table:  Score <3 = Low Risk for SUD  Score between 4-7 = Moderate Risk for SUD  Score >8 = High Risk for Opioid Abuse   PHQ-2 Depression Scale:  Total score:    PHQ-2 Scoring interpretation table: (Score and probability of major depressive disorder)  Score 0 = No depression  Score 1 = 15.4% Probability  Score 2 = 21.1% Probability  Score 3 = 38.4% Probability  Score 4 = 45.5% Probability  Score 5 = 56.4% Probability  Score 6 = 78.6% Probability   PHQ-9 Depression Scale:  Total score:    PHQ-9 Scoring interpretation table:  Score 0-4 = No depression  Score 5-9 = Mild depression  Score 10-14 = Moderate depression  Score 15-19 = Moderately severe depression  Score 20-27 = Severe depression (2.4 times higher risk of SUD and 2.89 times higher risk of overuse)   Pharmacologic Plan: As per protocol, I have not taken over any controlled substance management, pending the results of ordered tests and/or consults.            Initial impression: Pending review of available data and ordered tests.  Meds   Current Outpatient Medications:    acyclovir (ZOVIRAX) 200 MG capsule, Take by mouth., Disp: , Rfl:    alendronate (FOSAMAX) 70 MG tablet, Take 70 mg by mouth once a week., Disp:  , Rfl:  ALPRAZolam (XANAX) 0.5 MG tablet, Take 0.5 mg by mouth 2 (two) times daily as needed for anxiety., Disp: , Rfl:    amitriptyline (ELAVIL) 50 MG tablet, Take 50 mg by mouth 3 (three) times a week., Disp: , Rfl: 0   Ascorbic Acid (VITAMIN C) 1000 MG tablet, Take 1,000 mg by mouth 2 (two) times daily., Disp: , Rfl:    calcium carbonate (OSCAL) 1500 (600 Ca) MG TABS tablet, Take 1,500 mg by mouth daily with breakfast., Disp: , Rfl:    clindamycin (CLEOCIN-T) 1 % lotion, Apply to the face QAM (Patient taking differently: Apply 1 application topically daily as needed (Face).), Disp: 60 mL, Rfl: 1   diltiazem (CARDIZEM CD) 360 MG 24 hr capsule, Take 360 mg by mouth daily., Disp: , Rfl: 0   folic acid (FOLVITE) 1 MG tablet, Take 2 mg by mouth daily., Disp: , Rfl: 0   gabapentin (NEURONTIN) 300 MG capsule, Take 1 capsule by mouth 2 (two) times daily., Disp: , Rfl:    hydrocortisone 2.5 % cream, Apply 1 application topically daily as needed (face irritation)., Disp: , Rfl:    Magnesium Citrate 125 MG CAPS, Take 125 mg by mouth daily., Disp: , Rfl:    meloxicam (MOBIC) 15 MG tablet, Take 15 mg by mouth daily as needed (arthritis)., Disp: , Rfl: 0   methocarbamol (ROBAXIN) 500 MG tablet, 1.5 tablets, Disp: , Rfl:    mometasone (ELOCON) 0.1 % ointment, Apply to the face QHS x 2 weeks then decrease to 5d/wk. (Patient taking differently: Apply 1 application topically daily as needed (Apply to the face).), Disp: 45 g, Rfl: 0   Multiple Vitamins-Minerals (CENTRUM SILVER PO), Take 1 tablet by mouth daily., Disp: , Rfl:    Omega-3 Fatty Acids (FISH OIL PO), Take 2,000 mg by mouth 2 (two) times daily., Disp: , Rfl:    oxyCODONE-acetaminophen (PERCOCET) 5-325 MG tablet, Take 1 tablet by mouth every 4 (four) hours as needed for severe pain., Disp: 10 tablet, Rfl: 0   oxyCODONE-acetaminophen (PERCOCET/ROXICET) 5-325 MG tablet, 1 tablet as needed, Disp: , Rfl:    pregabalin (LYRICA) 75 MG capsule, Take 1 capsule  (75 mg total) by mouth daily for 14 days, THEN 1 capsule (75 mg total) 2 (two) times daily for 14 days, THEN 1 capsule (75 mg total) 3 (three) times daily., Disp: 132 capsule, Rfl: 0   promethazine (PHENERGAN) 25 MG tablet, Take by mouth., Disp: , Rfl:    Red Yeast Rice Extract (RED YEAST RICE PO), Take 2,400 mg by mouth daily., Disp: , Rfl:    Saw Palmetto-Phytosterols (PROSTATE SR PO), Take 2 tablets by mouth daily., Disp: , Rfl:    sildenafil (VIAGRA) 50 MG tablet, Take 50 mg by mouth daily as needed for erectile dysfunction., Disp: , Rfl:    sodium fluoride (FLUORISHIELD) 1.1 % GEL dental gel, Place 1 application onto teeth 2 (two) times daily., Disp: , Rfl:    Tofacitinib Citrate ER (XELJANZ XR) 11 MG TB24, Take 1 tablet by mouth daily., Disp: , Rfl:    traMADol (ULTRAM) 50 MG tablet, Take 50 mg by mouth 3 (three) times daily as needed., Disp: , Rfl:    triamcinolone cream (KENALOG) 0.1 %, Apply 1 application topically daily as needed (ECzema)., Disp: , Rfl:    zolpidem (AMBIEN) 10 MG tablet, Take 10 mg by mouth at bedtime as needed for sleep., Disp: , Rfl:   Imaging Review  Lumbosacral Imaging: Lumbar DG 2-3 views: Results for orders  placed during the hospital encounter of 07/31/21 DG Lumbar Spine 2-3 Views  Narrative CLINICAL DATA:  Back pain week. Unable to move 6 days ago.  EXAM: LUMBAR SPINE - 2-3 VIEW  COMPARISON:  CT abdomen pelvis 02/18/2020  FINDINGS: Five non rib-bearing lumbar type vertebral bodies are present. Vertebral body heights and alignment normal. Straightening of the normal lumbar lordosis is noted. Bone mineralization within limits. Moderate stool is present throughout the colon.  IMPRESSION: No acute or focal abnormality of the lumbar spine.   Electronically Signed By: San Morelle M.D. On: 08/02/2021 15:46  Other:  CLINICAL DATA:  LEFT lower quadrant pain and umbilical pain.   EXAM: CT ABDOMEN AND PELVIS WITH CONTRAST    TECHNIQUE: Multidetector CT imaging of the abdomen and pelvis was performed using the standard protocol following bolus administration of intravenous contrast.   CONTRAST:  129m OMNIPAQUE IOHEXOL 300 MG/ML  SOLN   COMPARISON:  CT 06/09/2008   FINDINGS: Lower chest: Lung bases are clear.   Hepatobiliary: Low-attenuation along the falciform ligament is favored focal fatty infiltration (image 16/2). No biliary duct dilatation. Gallbladder normal.   Pancreas: Pancreas is normal. No ductal dilatation. No pancreatic inflammation.   Spleen: Normal spleen   Adrenals/urinary tract: Adrenal glands normal. Simple fluid attenuation cystic lesion upper pole of the RIGHT kidney measures 17 mm. Similar cortical hypodensity in the mid kidney measures 10 mm also simple fluid attenuation. Ureters and bladder normal.   Stomach/Bowel: Stomach, duodenum small-bowel normal. Appendix normal. There is large volume stool in the cecum. Moderate volume stool in the ascending and transverse colon. Descending colon rectosigmoid colon normal.   Vascular/Lymphatic: Abdominal aorta is normal caliber. No periportal or retroperitoneal adenopathy. No pelvic adenopathy.   Reproductive: Prostate unremarkable   Other: No free fluid.  Fat filled LEFT inguinal hernia.   Musculoskeletal: No aggressive osseous lesion.   IMPRESSION: 1. Moderate to large volume stool in the RIGHT colon suggest constipation. 2. No diverticulosis. 3. Fat filled LEFT inguinal hernia.  (Herniorrhaphy done on 11/10/2020) 4. Benign appearing RIGHT renal cysts.     Electronically Signed   By: SSuzy BouchardM.D.   On: 02/18/2020 12:26  Complexity Note: Imaging results reviewed. Results shared with Mr. EBulger using Layman's terms.                        ROS  Cardiovascular: High blood pressure and Heart murmur Pulmonary or Respiratory: No reported pulmonary signs or symptoms such as wheezing and difficulty taking a deep  full breath (Asthma), difficulty blowing air out (Emphysema), coughing up mucus (Bronchitis), persistent dry cough, or temporary stoppage of breathing during sleep Neurological: No reported neurological signs or symptoms such as seizures, abnormal skin sensations, urinary and/or fecal incontinence, being born with an abnormal open spine and/or a tethered spinal cord Psychological-Psychiatric: Anxiousness, Depressed, Prone to panicking, and Difficulty sleeping and or falling asleep Gastrointestinal: Irregular, infrequent bowel movements (Constipation) Genitourinary: Passing kidney stones Hematological: No reported hematological signs or symptoms such as prolonged bleeding, low or poor functioning platelets, bruising or bleeding easily, hereditary bleeding problems, low energy levels due to low hemoglobin or being anemic Endocrine: No reported endocrine signs or symptoms such as high or low blood sugar, rapid heart rate due to high thyroid levels, obesity or weight gain due to slow thyroid or thyroid disease Rheumatologic: Rheumatoid arthritis Musculoskeletal: Negative for myasthenia gravis, muscular dystrophy, multiple sclerosis or malignant hyperthermia Work History: Working full time and Quit going to  work on his/her own  Allergies  Mr. Sroka is allergic to adhesive [tape], polysporin [bacitracin-polymyxin b], shellfish allergy, and neosporin [neomycin-bacitracin zn-polymyx].  Laboratory Chemistry Profile   Renal Lab Results  Component Value Date   BUN 15 02/18/2020   CREATININE 1.11 02/18/2020   GFRAA >60 02/18/2020   GFRNONAA >60 02/18/2020   PROTEINUR NEGATIVE 02/18/2020     Electrolytes Lab Results  Component Value Date   NA 138 02/18/2020   K 3.4 (L) 02/18/2020   CL 106 02/18/2020   CALCIUM 8.9 02/18/2020     Hepatic Lab Results  Component Value Date   AST 31 02/18/2020   ALT 36 02/18/2020   ALBUMIN 4.2 02/18/2020   ALKPHOS 59 02/18/2020   LIPASE 29 02/18/2020      ID Lab Results  Component Value Date   SARSCOV2NAA NEGATIVE 11/06/2020     Bone No results found for: VD25OH, VD125OH2TOT, OL0786LJ4, GB2010OF1, 25OHVITD1, 25OHVITD2, 25OHVITD3, TESTOFREE, TESTOSTERONE   Endocrine Lab Results  Component Value Date   GLUCOSE 168 (H) 02/18/2020   GLUCOSEU NEGATIVE 02/18/2020     Neuropathy No results found for: VITAMINB12, FOLATE, HGBA1C, HIV   CNS No results found for: COLORCSF, APPEARCSF, RBCCOUNTCSF, WBCCSF, POLYSCSF, LYMPHSCSF, EOSCSF, PROTEINCSF, GLUCCSF, JCVIRUS, CSFOLI, IGGCSF, LABACHR, ACETBL, LABACHR, ACETBL   Inflammation (CRP: Acute   ESR: Chronic) No results found for: CRP, ESRSEDRATE, LATICACIDVEN   Rheumatology No results found for: RF, ANA, LABURIC, URICUR, LYMEIGGIGMAB, LYMEABIGMQN, HLAB27   Coagulation Lab Results  Component Value Date   PLT 257 02/18/2020     Cardiovascular Lab Results  Component Value Date   CKTOTAL 54 09/07/2013   CKMB 1.4 09/07/2013   TROPONINI 0.03 09/07/2013   HGB 15.0 02/18/2020   HCT 43.9 02/18/2020     Screening Lab Results  Component Value Date   SARSCOV2NAA NEGATIVE 11/06/2020     Cancer No results found for: CEA, CA125, LABCA2   Allergens No results found for: ALMOND, APPLE, ASPARAGUS, AVOCADO, BANANA, BARLEY, BASIL, BAYLEAF, GREENBEAN, LIMABEAN, WHITEBEAN, BEEFIGE, REDBEET, BLUEBERRY, BROCCOLI, CABBAGE, MELON, CARROT, CASEIN, CASHEWNUT, CAULIFLOWER, CELERY     Note: Lab results reviewed.  [place]  Drug: Mr. Utsey  reports no history of drug use. Alcohol:  reports current alcohol use of about 1.0 standard drink per week. Tobacco:  reports that he has never smoked. He has never used smokeless tobacco. Medical:  has a past medical history of Arthritis, Dysplastic nevus (03/06/2020), Family history of colonic polyps, atypical nevus (09/10/2010), basal cell carcinoma (09/21/2004), dysplastic nevus (10/07/2009), Hypertension (2008), Osteoporosis, and Prostatitis. Family: family history  includes Colon polyps in his maternal uncle and mother; Heart disease in his father and mother.  Past Surgical History:  Procedure Laterality Date   COLONOSCOPY  09/11/2010   COLONOSCOPY WITH PROPOFOL N/A 02/04/2016   Procedure: COLONOSCOPY WITH PROPOFOL;  Surgeon: [name], MD;  Location: [facility] ENDOSCOPY;  Service: Endoscopy;  Laterality: N/A;   INGUINAL HERNIA REPAIR Left 11/10/2020   Procedure: HERNIA REPAIR INGUINAL ADULT WITH MESH;  Surgeon: [name], MD;  Location: [facility] ORS;  Service: General;  Laterality: Left;   SKIN LESION EXCISION  2011   Active Ambulatory Problems    Diagnosis Date Noted   Family history of colon cancer 01/12/2016   Chest pain, atypical 02/19/2014   Heart murmur 08/21/2014   Hypertension 08/21/2014   PVC's (premature ventricular contractions) 04/08/2020   Chronic pain syndrome 10/27/2021   Pharmacologic therapy 10/27/2021   Disorder of skeletal system 10/27/2021   Problems  influencing health status 10/27/2021   Chronic pain of inguinal region (Left) 10/27/2021   Chronic groin pain (1ry area of Pain) (Left) 10/27/2021   Chronic postoperative pain (inguinal region) (Left) 10/27/2021   Age-related osteoporosis without current pathological fracture 10/28/2021   Osteoarthritis 10/28/2021   Other long term (current) drug therapy 10/28/2021   Hand joint pain 10/28/2021   Pain in wrist 10/28/2021   Rheumatoid arthritis ([place]) 10/28/2021   Chronic low back pain (2ry area of Pain) (Left) w/o sciatica 10/28/2021   Chronic knee pain (3ry area of Pain) (Right) 10/28/2021   History of claustrophobia 10/28/2021   Generalized anxiety disorder 10/28/2021   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 In no apparent acute distress Vitals:   10/28/21 0950 10/28/21 1017  BP: (!) 137/107 (!) 146/77  Pulse: 77   Resp: 16   Temp: 97.9 F (36.6 C)   TempSrc: Temporal   SpO2: 99%   Weight: 155 lb (70.3 kg)   Height: 6' 0.5" (1.842 m)    BMI Assessment: Estimated body mass index is 20.73 kg/m as calculated from the following:   Height as of this encounter: 6' 0.5" (1.842 m).   Weight as of this encounter: 155 lb (70.3 kg).  BMI interpretation table: BMI level Category Range association with higher incidence of chronic pain  <18 kg/m2 Underweight   18.5-24.9 kg/m2 Ideal body weight   25-29.9 kg/m2 Overweight Increased incidence by 20%  30-34.9 kg/m2 Obese (Class I) Increased incidence by 68%  35-39.9 kg/m2 Severe obesity (Class II) Increased incidence by 136%  >40 kg/m2 Extreme obesity (Class III) Increased incidence by 254%   Patient's current BMI Ideal Body weight  Body mass index is 20.73 kg/m. Ideal body weight: 78.8 kg (173 lb 9.8 oz)   BMI Readings from Last 4 Encounters:  10/28/21 20.73 kg/m  11/10/20 20.16 kg/m  11/07/20 20.19 kg/m  02/18/20 20.85 kg/m   Wt Readings from Last 4 Encounters:  10/28/21 155 lb (70.3 kg)  11/10/20 152 lb 12.5 oz (69.3 kg)  11/07/20 153 lb (69.4 kg)  02/18/20 158 lb (71.7 kg)    Psych/Mental status: Alert, oriented x 3 (person, place, & time)       Eyes: PERLA Respiratory: No evidence of acute respiratory distress  Assessment  Primary Diagnosis & Pertinent Problem List: The primary encounter diagnosis was Chronic groin pain (1ry area of Pain) (Left). 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  Visit Diagnosis (New problems to examiner): 1. Chronic groin pain (1ry area of  Pain) (Left)   2. Chronic postoperative pain (inguinal region) (Left)   3. Left lower quadrant abdominal pain   4. Chronic pain of inguinal region (Left)   5. Chronic low back pain (2ry area of Pain) (Left) w/o sciatica   6. Chronic knee pain (3ry area of Pain) (Right)   7. Generalized abdominal pain   8. Chronic neuropathic pain   9. Chronic pain syndrome   10. Pharmacologic therapy   11. Disorder of skeletal system   12. Problems influencing health status   13. History of claustrophobia   14. Generalized anxiety disorder   15. 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 Other situational type phobia    Plan of Care (Initial workup plan)  Note: Mr. Sehgal was reminded that as per protocol, today's visit has been an evaluation only. We have not taken over the patient's controlled substance management.  Problem-specific plan: No problem-specific Assessment & Plan notes found for this encounter.  Lab Orders         Compliance Drug Analysis, Ur         Comp. Metabolic Panel (12)         Magnesium         Vitamin B12         Sedimentation rate         25-Hydroxy vitamin D Lcms D2+D3         C-reactive protein     Imaging Orders         CT ABDOMEN PELVIS W WO CONTRAST         DG Lumbar Spine Complete W/Bend         CT LUMBAR SPINE WO CONTRAST         DG Knee Complete 4 Views Right     Referral Orders         Ambulatory referral to Pain Clinic     Procedure Orders          ILIONINGUINAL NERVE BLOCK     Pharmacotherapy (current): Medications ordered:  Meds ordered this encounter  Medications   pregabalin (LYRICA) 75 MG capsule    Sig: Take 1 capsule (75 mg total) by mouth daily for 14 days, THEN 1 capsule (75 mg total) 2 (two) times daily for 14 days, THEN 1 capsule (75 mg total) 3 (three) times daily.    Dispense:  132 capsule    Refill:  0    Fill one day early if pharmacy is closed on scheduled refill date. May substitute for generic if available.   Medications administered during this visit: Brien Mates had no medications administered during this visit.   Pharmacological management options:  Opioid Analgesics: The patient was informed that there is no guarantee that he would be a candidate for opioid analgesics. The decision will be made following CDC guidelines. This decision will be based on the results of diagnostic studies, as well as Mr. Groot risk profile.   Membrane stabilizer: To be determined at a later time  Muscle relaxant: To be determined at a later time  NSAID: To be determined at a later time  Other analgesic(s): To be determined at a later time   Interventional management options: Mr. Hashemi was informed that there is no guarantee that he would be a candidate for interventional therapies. The decision will be based on the results of diagnostic studies, as well as Mr. Hane risk profile.  Procedure(s) under consideration:  Pending results of ordered studies  Interventional Therapies  Risk   Complexity Considerations:   Estimated body mass index is 20.73 kg/m as calculated from the following:   Height as of this encounter: 6' 0.5" (1.842 m).   Weight as of this encounter: 155 lb (70.3 kg). ALLERGY: Adhesive tape; shellfish (able to tolerate contrast)   Planned   Pending:   Diagnostic left inguinal nerve block #1    Under consideration:   Diagnostic left inguinal nerve block #1    Completed:   None at this  time   Completed by other providers:   Diagnostic left inguinal nerve block under ultrasound-guidance x3 (02/03/2021) by Dr. Hervey Ard. "Area of most pronounced tenderness near the medial aspect of the inguinal incision towards the medial aspect of the inguinal ligament a total of 10 cc of 0.5% Xylocaine with 0.25% Marcaine with 1-200,000 notes of epinephrine and 4 mg of dexamethasone was injected. After a 10-minute observation. The area was markedly less tender"   Therapeutic   Palliative (PRN) options:   None established    Provider-requested follow-up: Return for (22mn), Proc-day (T,Th), (F2F), 2nd Visit, (Clinic) procedure: (L) Inguinal Blk #1, (Sed-anx).  Future Appointments  Date [place] 01/26/2022  1:30 PM KRalene Bathe MD [facility] None    Note by: FGaspar Cola MD Date: 10/28/2021; Time: 11:26 AM

## 2021-10-27 DIAGNOSIS — G8928 Other chronic postprocedural pain: Secondary | ICD-10-CM | POA: Insufficient documentation

## 2021-10-27 DIAGNOSIS — M899 Disorder of bone, unspecified: Secondary | ICD-10-CM | POA: Insufficient documentation

## 2021-10-27 DIAGNOSIS — R1032 Left lower quadrant pain: Secondary | ICD-10-CM | POA: Insufficient documentation

## 2021-10-27 DIAGNOSIS — G894 Chronic pain syndrome: Secondary | ICD-10-CM | POA: Insufficient documentation

## 2021-10-27 DIAGNOSIS — G8929 Other chronic pain: Secondary | ICD-10-CM | POA: Insufficient documentation

## 2021-10-27 DIAGNOSIS — Z789 Other specified health status: Secondary | ICD-10-CM | POA: Insufficient documentation

## 2021-10-27 DIAGNOSIS — Z79899 Other long term (current) drug therapy: Secondary | ICD-10-CM | POA: Insufficient documentation

## 2021-10-28 ENCOUNTER — Ambulatory Visit
Admission: RE | Admit: 2021-10-28 | Discharge: 2021-10-28 | Disposition: A | Discharge status: Home / Self Care | Payer: BC Managed Care – PPO | Source: Ambulatory Visit | Attending: Pain Medicine | Admitting: Pain Medicine

## 2021-10-28 ENCOUNTER — Ambulatory Visit: Payer: BC Managed Care – PPO | Admitting: Pain Medicine

## 2021-10-28 ENCOUNTER — Encounter: Payer: Self-pay | Admitting: Pain Medicine

## 2021-10-28 ENCOUNTER — Other Ambulatory Visit: Payer: Self-pay

## 2021-10-28 ENCOUNTER — Ambulatory Visit
Admission: RE | Admit: 2021-10-28 | Discharge: 2021-10-28 | Disposition: A | Discharge status: Home / Self Care | Payer: BC Managed Care – PPO | Attending: Pain Medicine | Admitting: Pain Medicine

## 2021-10-28 VITALS — BP 146/77 | HR 77 | Temp 97.9°F | Resp 16 | Ht 72.5 in | Wt 155.0 lb

## 2021-10-28 DIAGNOSIS — M545 Low back pain, unspecified: Secondary | ICD-10-CM | POA: Insufficient documentation

## 2021-10-28 DIAGNOSIS — G8929 Other chronic pain: Secondary | ICD-10-CM | POA: Insufficient documentation

## 2021-10-28 DIAGNOSIS — R1032 Left lower quadrant pain: Secondary | ICD-10-CM | POA: Insufficient documentation

## 2021-10-28 DIAGNOSIS — G8928 Other chronic postprocedural pain: Secondary | ICD-10-CM | POA: Insufficient documentation

## 2021-10-28 DIAGNOSIS — F40241 Acrophobia: Secondary | ICD-10-CM | POA: Insufficient documentation

## 2021-10-28 DIAGNOSIS — M25549 Pain in joints of unspecified hand: Secondary | ICD-10-CM | POA: Insufficient documentation

## 2021-10-28 DIAGNOSIS — R1084 Generalized abdominal pain: Secondary | ICD-10-CM

## 2021-10-28 DIAGNOSIS — M25561 Pain in right knee: Secondary | ICD-10-CM

## 2021-10-28 DIAGNOSIS — M1990 Unspecified osteoarthritis, unspecified site: Secondary | ICD-10-CM | POA: Insufficient documentation

## 2021-10-28 DIAGNOSIS — M069 Rheumatoid arthritis, unspecified: Secondary | ICD-10-CM | POA: Insufficient documentation

## 2021-10-28 DIAGNOSIS — M899 Disorder of bone, unspecified: Secondary | ICD-10-CM

## 2021-10-28 DIAGNOSIS — F40248 Other situational type phobia: Secondary | ICD-10-CM | POA: Insufficient documentation

## 2021-10-28 DIAGNOSIS — Z8659 Personal history of other mental and behavioral disorders: Secondary | ICD-10-CM | POA: Insufficient documentation

## 2021-10-28 DIAGNOSIS — Z79899 Other long term (current) drug therapy: Secondary | ICD-10-CM

## 2021-10-28 DIAGNOSIS — Z789 Other specified health status: Secondary | ICD-10-CM | POA: Insufficient documentation

## 2021-10-28 DIAGNOSIS — M792 Neuralgia and neuritis, unspecified: Secondary | ICD-10-CM

## 2021-10-28 DIAGNOSIS — F411 Generalized anxiety disorder: Secondary | ICD-10-CM

## 2021-10-28 DIAGNOSIS — G894 Chronic pain syndrome: Secondary | ICD-10-CM | POA: Insufficient documentation

## 2021-10-28 DIAGNOSIS — M25539 Pain in unspecified wrist: Secondary | ICD-10-CM | POA: Insufficient documentation

## 2021-10-28 DIAGNOSIS — M810 Age-related osteoporosis without current pathological fracture: Secondary | ICD-10-CM | POA: Insufficient documentation

## 2021-10-28 MED ORDER — PREGABALIN 75 MG PO CAPS: ORAL_CAPSULE | ORAL | 0 refills | Status: DC

## 2021-10-28 NOTE — Progress Notes (Signed)
Safety precautions to be maintained throughout the outpatient stay will include: orient to surroundings, keep bed in low position, maintain call bell within reach at all times, provide assistance with transfer out of bed and ambulation.  

## 2021-10-28 NOTE — Patient Instructions (Addendum)
______________________________________________________________________ ° °Preparing for Procedure with Sedation ° °NOTICE: Due to recent regulatory changes, starting on April 20, 2021, procedures requiring intravenous (IV) sedation will no longer be performed at the [facility].  These types of procedures are required to be performed at [facility] ambulatory surgery facility.  We are very sorry for the inconvenience. ° °Procedure appointments are limited to planned procedures: °No Prescription Refills. °No disability issues will be discussed. °No medication changes will be discussed. ° °Instructions: °Oral Intake: Do not eat or drink anything for at least 8 hours prior to your procedure. (Exception: Blood Pressure Medication. See below.) °Transportation: A driver is required. You may not drive yourself after the procedure. °Blood Pressure Medicine: Do not forget to take your blood pressure medicine with a sip of water the morning of the procedure. If your Diastolic (lower reading) is above 100 mmHg, elective cases will be cancelled/rescheduled. °Blood thinners: These will need to be stopped for procedures. Notify our staff if you are taking any blood thinners. Depending on which one you take, there will be specific instructions on how and when to stop it. °Diabetics on insulin: Notify the staff so that you can be scheduled 1st case in the morning. If your diabetes requires high dose insulin, take only ½ of your normal insulin dose the morning of the procedure and notify the staff that you have done so. °Preventing infections: Shower with an antibacterial soap the morning of your procedure. °Build-up your immune system: Take 1000 mg of Vitamin C with every meal (3 times a day) the day prior to your procedure. °Antibiotics: Inform the staff if you have a condition or reason that requires you to take antibiotics before dental procedures. °Pregnancy: If you are pregnant, call and cancel the procedure. °Sickness: If  you have a cold, fever, or any active infections, call and cancel the procedure. °Arrival: You must be in the facility at least 30 minutes prior to your scheduled procedure. °Children: Do not bring children with you. °Dress appropriately: Bring dark clothing that you would not mind if they get stained. °Valuables: Do not bring any jewelry or valuables. ° °Reasons to call and reschedule or cancel your procedure: (Following these recommendations will minimize the risk of a serious complication.) °Surgeries: Avoid having procedures within 2 weeks of any surgery. (Avoid for 2 weeks before or after any surgery). °Flu Shots: Avoid having procedures within 2 weeks of a flu shots. (Avoid for 2 weeks before or after immunizations). °Barium: Avoid having a procedure within 7-10 days after having had a radiological study involving the use of radiological contrast. (Myelograms, Barium swallow or enema study). °Heart attacks: Avoid any elective procedures or surgeries for the initial 6 months after a "Myocardial Infarction" (Heart Attack). °Blood thinners: It is imperative that you stop these medications before procedures. Let us know if you if you take any blood thinner.  °Infection: Avoid procedures during or within two weeks of an infection (including chest colds or gastrointestinal problems). Symptoms associated with infections include: Localized redness, fever, chills, night sweats or profuse sweating, burning sensation when voiding, cough, congestion, stuffiness, runny nose, sore throat, diarrhea, nausea, vomiting, cold or Flu symptoms, recent or current infections. It is specially important if the infection is over the area that we intend to treat. °Heart and lung problems: Symptoms that may suggest an active cardiopulmonary problem include: cough, chest pain, breathing difficulties or shortness of breath, dizziness, ankle swelling, uncontrolled high or unusually low blood pressure, and/or palpitations. If you are    experiencing any of these symptoms, cancel your procedure and contact your primary care physician for an evaluation. ° °Remember:  °Regular Business hours are:  °Monday to Thursday 8:00 AM to 4:00 PM ° °Provider's Schedule: °Arpi Diebold, MD:  °Procedure days: Tuesday and Thursday 7:30 AM to 4:00 PM ° °Bilal Lateef, MD:  °Procedure days: Monday and Wednesday 7:30 AM to 4:00 PM °______________________________________________________________________ ° ____________________________________________________________________________________________ ° °General Risks and Possible Complications ° °Patient Responsibilities: It is important that you read this as it is part of your informed consent. It is our duty to inform you of the risks and possible complications associated with treatments offered to you. It is your responsibility as a patient to read this and to ask questions about anything that is not clear or that you believe was not covered in this document. ° °Patient’s Rights: You have the right to refuse treatment. You also have the right to change your mind, even after initially having agreed to have the treatment done. However, under this last option, if you wait until the last second to change your mind, you may be charged for the materials used up to that point. ° °Introduction: Medicine is not an exact science. Everything in Medicine, including the lack of treatment(s), carries the potential for danger, harm, or loss (which is by definition: Risk). In Medicine, a complication is a secondary problem, condition, or disease that can aggravate an already existing one. All treatments carry the risk of possible complications. The fact that a side effects or complications occurs, does not imply that the treatment was conducted incorrectly. It must be clearly understood that these can happen even when everything is done following the highest safety standards. ° °No treatment: You can choose not to proceed with the  proposed treatment alternative. The “PRO(s)” would include: avoiding the risk of complications associated with the therapy. The “CON(s)” would include: not getting any of the treatment benefits. These benefits fall under one of three categories: diagnostic; therapeutic; and/or palliative. Diagnostic benefits include: getting information which can ultimately lead to improvement of the disease or symptom(s). Therapeutic benefits are those associated with the successful treatment of the disease. Finally, palliative benefits are those related to the decrease of the primary symptoms, without necessarily curing the condition (example: decreasing the pain from a flare-up of a chronic condition, such as incurable terminal cancer). ° °General Risks and Complications: These are associated to most interventional treatments. They can occur alone, or in combination. They fall under one of the following six (6) categories: no benefit or worsening of symptoms; bleeding; infection; nerve damage; allergic reactions; and/or death. °No benefits or worsening of symptoms: In Medicine there are no guarantees, only probabilities. No healthcare provider can ever guarantee that a medical treatment will work, they can only state the probability that it may. Furthermore, there is always the possibility that the condition may worsen, either directly, or indirectly, as a consequence of the treatment. °Bleeding: This is more common if the patient is taking a blood thinner, either prescription or over the counter (example: Goody Powders, Fish oil, Aspirin, Garlic, etc.), or if suffering a condition associated with impaired coagulation (example: Hemophilia, cirrhosis of the liver, low platelet counts, etc.). However, even if you do not have one on these, it can still happen. If you have any of these conditions, or take one of these drugs, make sure to notify your treating physician. °Infection: This is more common in patients with a compromised  immune system, either due to disease (example:   diabetes, cancer, human immunodeficiency virus [HIV], etc.), or due to medications or treatments (example: therapies used to treat cancer and rheumatological diseases). However, even if you do not have one on these, it can still happen. If you have any of these conditions, or take one of these drugs, make sure to notify your treating physician. Nerve Damage: This is more common when the treatment is an invasive one, but it can also happen with the use of medications, such as those used in the treatment of cancer. The damage can occur to small secondary nerves, or to large primary ones, such as those in the spinal cord and brain. This damage may be temporary or permanent and it may lead to impairments that can range from temporary numbness to permanent paralysis and/or brain death. Allergic Reactions: Any time a substance or material comes in contact with our body, there is the possibility of an allergic reaction. These can range from a mild skin rash (contact dermatitis) to a severe systemic reaction (anaphylactic reaction), which can result in death. Death: In general, any medical intervention can result in death, most of the time due to an unforeseen complication. ____________________________________________________________________________________________  ______________________________________________________________________________________________  Specialty Pain Scale  Introduction:  There are significant differences in how pain is reported. The word pain usually refers to physical pain, but it is also a common synonym of suffering. The medical community uses a scale from 0 (zero) to 10 (ten) to report pain level. Zero (0) is described as "no pain", while ten (10) is described as "the worse pain you can imagine". The problem with this scale is that physical pain is reported along with suffering. Suffering refers to mental pain, or more often yet it refers  to any unpleasant feeling, emotion or aversion associated with the perception of harm or threat of harm. It is the psychological component of pain.  Pain Specialists prefer to separate the two components. The pain scale used by this practice is the Verbal Numerical Rating Scale (VNRS-11). This scale is for the physical pain only. DO NOT INCLUDE how your pain psychologically affects you. This scale is for adults 43 years of age and older. It has 11 (eleven) levels. The 1st level is 0/10. This means: "right now, I have no pain". In the context of pain management, it also means: "right now, my physical pain is under control with the current therapy".  General Information:  The scale should reflect your current level of pain. Unless you are specifically asked for the level of your worst pain, or your average pain. If you are asked for one of these two, then it should be understood that it is over the past 24 hours.  Levels 1 (one) through 5 (five) are described below, and can be treated as an outpatient. Ambulatory pain management facilities such as ours are more than adequate to treat these levels. Levels 6 (six) through 10 (ten) are also described below, however, these must be treated as a hospitalized patient. While levels 6 (six) and 7 (seven) may be evaluated at an urgent care facility, levels 8 (eight) through 10 (ten) constitute medical emergencies and as such, they belong in a hospital's emergency department. When having these levels (as described below), do not come to our office. Our facility is not equipped to manage these levels. Go directly to an urgent care facility or an emergency department to be evaluated.  Definitions:  Activities of Daily Living (ADL): Activities of daily living (ADL or ADLs) is a term used  in healthcare to refer to people's daily self-care activities. Health professionals often use a person's ability or inability to perform ADLs as a measurement of their functional status,  particularly in regard to people post injury, with disabilities and the elderly. There are two ADL levels: Basic and Instrumental. Basic Activities of Daily Living (BADL  or BADLs) consist of self-care tasks that include: Bathing and showering; personal hygiene and grooming (including brushing/combing/styling hair); dressing; Toilet hygiene (getting to the toilet, cleaning oneself, and getting back up); eating and self-feeding (not including cooking or chewing and swallowing); functional mobility, often referred to as "transferring", as measured by the ability to walk, get in and out of bed, and get into and out of a chair; the broader definition (moving from one place to another while performing activities) is useful for people with different physical abilities who are still able to get around independently. Basic ADLs include the things many people do when they get up in the morning and get ready to go out of the house: get out of bed, go to the toilet, bathe, dress, groom, and eat. On the average, loss of function typically follows a particular order. Hygiene is the first to go, followed by loss of toilet use and locomotion. The last to go is the ability to eat. When there is only one remaining area in which the person is independent, there is a 62.9% chance that it is eating and only a 3.5% chance that it is hygiene. Instrumental Activities of Daily Living (IADL or IADLs) are not necessary for fundamental functioning, but they let an individual live independently in a community. IADL consist of tasks that include: cleaning and maintaining the house; home establishment and maintenance; care of others (including selecting and supervising caregivers); care of pets; child rearing; managing money; managing financials (investments, etc.); meal preparation and cleanup; shopping for groceries and necessities; moving within the community; safety procedures and emergency responses; health management and maintenance  (taking prescribed medications); and using the telephone or other form of communication.  Instructions:  Most patients tend to report their pain as a combination of two factors, their physical pain and their psychosocial pain. This last one is also known as suffering and it is reflection of how physical pain affects you socially and psychologically. From now on, report them separately.  From this point on, when asked to report your pain level, report only your physical pain. Use the following table for reference.  Pain Clinic Pain Levels (0-5/10)  Pain Level Score  Description  No Pain 0   Mild pain 1 Nagging, annoying, but does not interfere with basic activities of daily living (ADL). Patients are able to eat, bathe, get dressed, toileting (being able to get on and off the toilet and perform personal hygiene functions), transfer (move in and out of bed or a chair without assistance), and maintain continence (able to control bladder and bowel functions). Blood pressure and heart rate are unaffected. A normal heart rate for a healthy adult ranges from 60 to 100 bpm (beats per minute).   Mild to moderate pain 2 Noticeable and distracting. Impossible to hide from other people. More frequent flare-ups. Still possible to adapt and function close to normal. It can be very annoying and may have occasional stronger flare-ups. With discipline, patients may get used to it and adapt.   Moderate pain 3 Interferes significantly with activities of daily living (ADL). It becomes difficult to feed, bathe, get dressed, get on and off the toilet  or to perform personal hygiene functions. Difficult to get in and out of bed or a chair without assistance. Very distracting. With effort, it can be ignored when deeply involved in activities.   Moderately severe pain 4 Impossible to ignore for more than a few minutes. With effort, patients may still be able to manage work or participate in some social activities. Very  difficult to concentrate. Signs of autonomic nervous system discharge are evident: dilated pupils (mydriasis); mild sweating (diaphoresis); sleep interference. Heart rate becomes elevated (>115 bpm). Diastolic blood pressure (lower number) rises above 100 mmHg. Patients find relief in laying down and not moving.   Severe pain 5 Intense and extremely unpleasant. Associated with frowning face and frequent crying. Pain overwhelms the senses.  Ability to do any activity or maintain social relationships becomes significantly limited. Conversation becomes difficult. Pacing back and forth is common, as getting into a comfortable position is nearly impossible. Pain wakes you up from deep sleep. Physical signs will be obvious: pupillary dilation; increased sweating; goosebumps; brisk reflexes; cold, clammy hands and feet; nausea, vomiting or dry heaves; loss of appetite; significant sleep disturbance with inability to fall asleep or to remain asleep. When persistent, significant weight loss is observed due to the complete loss of appetite and sleep deprivation.  Blood pressure and heart rate becomes significantly elevated. Caution: If elevated blood pressure triggers a pounding headache associated with blurred vision, then the patient should immediately seek attention at an urgent or emergency care unit, as these may be signs of an impending stroke.    Emergency Department Pain Levels (6-10/10)  Emergency Room Pain 6 Severely limiting. Requires emergency care and should not be seen or managed at an outpatient pain management facility. Communication becomes difficult and requires great effort. Assistance to reach the emergency department may be required. Facial flushing and profuse sweating along with potentially dangerous increases in heart rate and blood pressure will be evident.   Distressing pain 7 Self-care is very difficult. Assistance is required to transport, or use restroom. Assistance to reach the  emergency department will be required. Tasks requiring coordination, such as bathing and getting dressed become very difficult.   Disabling pain 8 Self-care is no longer possible. At this level, pain is disabling. The individual is unable to do even the most basic activities such as walking, eating, bathing, dressing, transferring to a bed, or toileting. Fine motor skills are lost. It is difficult to think clearly.   Incapacitating pain 9 Pain becomes incapacitating. Thought processing is no longer possible. Difficult to remember your own name. Control of movement and coordination are lost.   The worst pain imaginable 10 At this level, most patients pass out from pain. When this level is reached, collapse of the autonomic nervous system occurs, leading to a sudden drop in blood pressure and heart rate. This in turn results in a temporary and dramatic drop in blood flow to the brain, leading to a loss of consciousness. Fainting is one of the bodys self defense mechanisms. Passing out puts the brain in a calmed state and causes it to shut down for a while, in order to begin the healing process.    Summary: 1.   Refer to this scale when providing [place] with your pain level. 2.   Be accurate and careful when reporting your pain level. This will help with your care. 3.   Over-reporting your pain level will lead to loss of credibility. 4.   Even a level of 1/10 means  that there is pain and will be treated at our facility. 5.   High, inaccurate reporting will be documented as Symptom Exaggeration, leading to loss of credibility and suspicions of possible secondary gains such as obtaining more narcotics, or wanting to appear disabled, for fraudulent reasons. 6.   Only pain levels of 5 or below will be seen at our facility. 7.   Pain levels of 6 and above will be sent to the Emergency Department and the appointment  cancelled.  ______________________________________________________________________________________________  Pregabalin Capsules What is this medication? PREGABALIN (pre GAB a lin) treats nerve pain. It may also be used to prevent and control seizures in people with epilepsy. It works by calming overactive nerves in your body. This medicine may be used for other purposes; ask your health care provider or pharmacist if you have questions. COMMON BRAND NAME(S): Lyrica What should I tell my care team before I take this medication? They need to know if you have any of these conditions: Drug abuse or addiction Heart failure Kidney disease Lung disease Suicidal thoughts, plans or attempt An unusual or allergic reaction to pregabalin, other medications, foods, dyes, or preservatives Pregnant or trying to get pregnant Breast-feeding How should I use this medication? Take this medication by mouth with water. Take it as directed on the prescription label at the same time every day. You can take it with or without food. If it upsets your stomach, take it with food. Keep taking it unless your care team tells you to stop. A special MedGuide will be given to you by the pharmacist with each prescription and refill. Be sure to read this information carefully each time. Talk to your care team about the use of this medication in children. While it may be prescribed for children as young as 1 month for selected conditions, precautions do apply. Overdosage: If you think you have taken too much of this medicine contact a poison control center or emergency room at once. NOTE: This medicine is only for you. Do not share this medicine with others. What if I miss a dose? If you miss a dose, take it as soon as you can. If it is almost time for your next dose, take only that dose. Do not take double or extra doses. What may interact with this medication? Alcohol Antihistamines for allergy, cough, and cold Certain  medications for anxiety or sleep Certain medications for blood pressure, heart disease Certain medications for depression like amitriptyline, fluoxetine, sertraline Certain medications for diabetes, like pioglitazone, rosiglitazone Certain medications for seizures like phenobarbital, primidone General anesthetics like halothane, isoflurane, methoxyflurane, propofol Medications that relax muscles for surgery Narcotic medications for pain Phenothiazines like chlorpromazine, mesoridazine, prochlorperazine, thioridazine This list may not describe all possible interactions. Give your health care provider a list of all the medicines, herbs, non-prescription drugs, or dietary supplements you use. Also tell them if you smoke, drink alcohol, or use illegal drugs. Some items may interact with your medicine. What should I watch for while using this medication? Visit your care team for regular checks on your progress. Tell your care team if your symptoms do not start to get better or if they get worse. Do not suddenly stop taking this medication. You may develop a severe reaction. Your care team will tell you how much medication to take. If your care team wants you to stop the medication, the dose may be slowly lowered over time to avoid any side effects. You may get drowsy or dizzy. Do not  drive, use machinery, or do anything that needs mental alertness until you know how this medication affects you. Do not stand up or sit up quickly, especially if you are an older patient. This reduces the risk of dizzy or fainting spells. Alcohol may interfere with the effect of this medication. Avoid alcoholic drinks. If you or your family notice any changes in your behavior, such as new or worsening depression, thoughts of harming yourself, anxiety, other unusual or disturbing thoughts, or memory loss, call your care team right away. Wear a medical ID bracelet or chain if you are taking this medication for seizures. Carry a  card that describes your condition. List the medications and doses you take on the card. This medication may make it more difficult to father a child. Talk to your care team if you are concerned about your fertility. What side effects may I notice from receiving this medication? Side effects that you should report to your care team as soon as possible: Allergic reactions or angioedema--skin rash, itching, hives, swelling of the face, eyes, lips, tongue, arms, or legs, trouble swallowing or breathing Blurry vision Thoughts of suicide or self-harm, worsening mood, feelings of depression Trouble breathing Side effects that usually do not require medical attention (report to your care team if they continue or are bothersome): Dizziness Drowsiness Dry mouth Nausea Swelling of the ankles, feet, hands Vomiting Weight gain This list may not describe all possible side effects. Call your doctor for medical advice about side effects. You may report side effects to FDA at 1-800-FDA-1088. Where should I keep my medication? Keep out of the reach of children and pets. This medication can be abused. Keep it in a safe place to protect it from theft. Do not share it with anyone. It is only for you. Selling or giving away this medication is dangerous and against the law. Store at [employer] [address] (77 degrees F). Get rid of any unused medication after the expiration date. This medication may cause harm and death if it is taken by other adults, children, or pets. It is important to get rid of the medication as soon as you no longer need it, or it is expired. You can do this in two ways: Take the medication to a medication take-back program. Check with your pharmacy or law enforcement to find a location. If you cannot return the medication, check the label or package insert to see if the medication should be thrown out in the garbage or flushed down the toilet. If you are not sure, ask your care team. If it is safe to  put it in the trash, take the medication out of the container. Mix the medication with cat litter, dirt, coffee grounds, or other unwanted substance. Seal the mixture in a bag or container. Put it in the trash. NOTE: This sheet is a summary. It may not cover all possible information. If you have questions about this medicine, talk to your doctor, pharmacist, or health care provider.  2022 Elsevier/Gold Standard (2020-09-10 00:00:00)

## 2021-11-02 ENCOUNTER — Telehealth: Payer: Self-pay

## 2021-11-02 NOTE — Telephone Encounter (Signed)
His insurance denied authorization for bothe the CT's you ordered. There are not enough treatment notes. PT, medications, therapy, 6 weeks of treatment etc.

## 2021-11-04 LAB — COMPLIANCE DRUG ANALYSIS, UR

## 2021-11-05 ENCOUNTER — Encounter: Payer: Self-pay | Admitting: Pain Medicine

## 2021-11-05 ENCOUNTER — Other Ambulatory Visit: Payer: Self-pay

## 2021-11-05 ENCOUNTER — Ambulatory Visit
Admission: RE | Admit: 2021-11-05 | Discharge: 2021-11-05 | Disposition: A | Discharge status: Home / Self Care | Payer: BC Managed Care – PPO | Source: Ambulatory Visit | Attending: Pain Medicine | Admitting: Pain Medicine

## 2021-11-05 ENCOUNTER — Ambulatory Visit: Payer: BC Managed Care – PPO | Admitting: Pain Medicine

## 2021-11-05 VITALS — BP 134/87 | HR 85 | Temp 97.2°F | Resp 14 | Ht 73.0 in | Wt 157.0 lb

## 2021-11-05 DIAGNOSIS — G8929 Other chronic pain: Secondary | ICD-10-CM | POA: Insufficient documentation

## 2021-11-05 DIAGNOSIS — G8928 Other chronic postprocedural pain: Secondary | ICD-10-CM | POA: Insufficient documentation

## 2021-11-05 DIAGNOSIS — R1032 Left lower quadrant pain: Secondary | ICD-10-CM | POA: Insufficient documentation

## 2021-11-05 MED ORDER — PENTAFLUOROPROP-TETRAFLUOROETH EX AERO
INHALATION_SPRAY | Freq: Once | CUTANEOUS | Status: DC
  Filled 2021-11-05: qty 116

## 2021-11-05 MED ORDER — LIDOCAINE HCL 2 % IJ SOLN
INTRAMUSCULAR | Status: AC
  Filled 2021-11-05: qty 20

## 2021-11-05 MED ORDER — GLYCOPYRROLATE 0.2 MG/ML IJ SOLN
INTRAMUSCULAR | Status: AC
  Filled 2021-11-05: qty 1

## 2021-11-05 MED ORDER — MIDAZOLAM HCL 5 MG/5ML IJ SOLN
INTRAMUSCULAR | Status: AC
  Filled 2021-11-05: qty 5

## 2021-11-05 MED ORDER — ROPIVACAINE HCL 2 MG/ML IJ SOLN
9.0000 mL | Freq: Once | INTRAMUSCULAR | Status: AC
  Administered 2021-11-05: 9 mL

## 2021-11-05 MED ORDER — TRIAMCINOLONE ACETONIDE 40 MG/ML IJ SUSP
40.0000 mg | Freq: Once | INTRAMUSCULAR | Status: AC
  Administered 2021-11-05: 40 mg

## 2021-11-05 MED ORDER — LACTATED RINGERS IV SOLN
1000.0000 mL | Freq: Once | INTRAVENOUS | Status: AC
  Administered 2021-11-05: 1000 mL via INTRAVENOUS

## 2021-11-05 MED ORDER — GLYCOPYRROLATE 0.2 MG/ML IJ SOLN
0.2000 mg | Freq: Once | INTRAMUSCULAR | Status: AC
  Administered 2021-11-05: 0.2 mg via INTRAVENOUS

## 2021-11-05 MED ORDER — MIDAZOLAM HCL 5 MG/5ML IJ SOLN
0.5000 mg | Freq: Once | INTRAMUSCULAR | Status: AC
  Administered 2021-11-05: 2 mg via INTRAVENOUS

## 2021-11-05 MED ORDER — TRIAMCINOLONE ACETONIDE 40 MG/ML IJ SUSP
INTRAMUSCULAR | Status: AC
  Filled 2021-11-05: qty 1

## 2021-11-05 MED ORDER — LIDOCAINE HCL 2 % IJ SOLN
20.0000 mL | Freq: Once | INTRAMUSCULAR | Status: AC
  Administered 2021-11-05: 400 mg

## 2021-11-05 MED ORDER — ROPIVACAINE HCL 2 MG/ML IJ SOLN
INTRAMUSCULAR | Status: AC
  Filled 2021-11-05: qty 20

## 2021-11-05 NOTE — Patient Instructions (Signed)

## 2021-11-05 NOTE — Progress Notes (Signed)
Safety precautions to be maintained throughout the outpatient stay will include: orient to surroundings, keep bed in low position, maintain call bell within reach at all times, provide assistance with transfer out of bed and ambulation.  

## 2021-11-05 NOTE — Progress Notes (Signed)
PROVIDER NOTE: Interpretation of information contained herein should be left to medically-trained personnel. Specific patient instructions are provided elsewhere under "Patient Instructions" section of medical record. This document was created in part using STT-dictation technology, any transcriptional errors that may result from this process are unintentional.  Patient: Marcus Malone Type: Established DOB: 05/15/1956 MRN: [id number] PCP: Albina Billet, MD  Service: Procedure DOS: 11/05/2021 Setting: Ambulatory Location: Ambulatory outpatient facility Delivery: Face-to-face Provider: Gaspar Cola, MD Specialty: Interventional Pain Management Specialty designation: 09 Location: Outpatient facility Ref. Prov.: Albina Billet, MD    Primary Reason for Visit: Interventional Pain Management Treatment. CC: Groin Pain (left) and Back Pain (low)   Procedure:           Type: Ilioinguinal Nerve Block ([id number]) #1  Laterality: Left (-LT)  Level: ASIS  Imaging: Fluoroscopic guidance ([id number]) Anesthesia: Local anesthesia (1-2% Lidocaine) Anxiolysis: IV Versed 2.0 mg Sedation: None. DOS: 11/05/2021  Performed by: Gaspar Cola, MD  Purpose: Diagnostic/Therapeutic Indications: Groin pain severe enough to impact quality of life or function. 1. Chronic pain of inguinal region (Left)   2. Chronic groin pain (1ry area of Pain) (Left)   3. Chronic postoperative pain (inguinal region) (Left)   4. Chronic pain of left inguinal region   5. Chronic groin pain, left   6. Chronic postoperative pain    NAS-11 Pain score:   Pre-procedure: 2 /10   Post-procedure: 0-No pain/10     Position / Prep / Materials:  Position: Supine with a pillow under the knees.  Prep solution: DuraPrep (Iodine Povacrylex [0.7% available iodine] and Isopropyl Alcohol, 74% w/w) Prep Area: Entire lower abdominal area from transumbilical plane (X4-8 disc level) down to public tubercle/symphysis pubis level;  and from flank to flank, making sure to cover the anterior superior iliac spine (ASIS) area.  Materials:  Tray: Block tray Needle(s):  Type: Regular needle  Gauge (G): 25-G  Length: 1.5-in  Qty: 1  Pre-op H&P Assessment:  Marcus Malone is a 66 y.o. (year old), male patient, seen today for interventional treatment. He  has a past surgical history that includes Skin lesion excision (2011); Colonoscopy (09/11/2010); Colonoscopy with propofol (N/A, 02/04/2016); and Inguinal hernia repair (Left, 11/10/2020). Marcus Malone has a current medication list which includes the following prescription(s): acyclovir, alendronate, alprazolam, amitriptyline, vitamin c, calcium carbonate, clindamycin, diltiazem, folic acid, hydrocortisone, magnesium citrate, meloxicam, methocarbamol, mometasone, multiple vitamins-minerals, omega-3 fatty acids, oxycodone-acetaminophen, pregabalin, promethazine, red yeast rice extract, saw palmetto-phytosterols, sildenafil, sodium fluoride, xeljanz xr, tramadol, triamcinolone cream, zolpidem, gabapentin, and oxycodone-acetaminophen, and the following Facility-Administered Medications: pentafluoroprop-tetrafluoroeth. His primarily concern today is the Groin Pain (left) and Back Pain (low)  Initial Vital Signs:  Pulse/HCG Rate: 85ECG Heart Rate: 83 Temp: (!) 97.2 F (36.2 C) Resp: 16 BP: (!) 143/104 SpO2: 99 %  BMI: Estimated body mass index is 20.71 kg/m as calculated from the following:   Height as of this encounter: 6\' 1"  (1.854 m).   Weight as of this encounter: 157 lb (71.2 kg).  Risk Assessment: Allergies: Reviewed. He is allergic to adhesive [tape], bacitracin-polymyxin b, shellfish allergy, and neosporin [neomycin-bacitracin zn-polymyx].  Allergy Precautions: None required Coagulopathies: Reviewed. None identified.  Blood-thinner therapy: None at this time Active Infection(s): Reviewed. None identified. Marcus Malone is afebrile  Site Confirmation: Marcus Malone was asked to  confirm the procedure and laterality before marking the site Procedure checklist: Completed Consent: Before the procedure and under the influence of no sedative(s), amnesic(s), or anxiolytics, the patient was informed  of the treatment options, risks and possible complications. To fulfill our ethical and legal obligations, as recommended by the American Medical Association's Code of Ethics, I have informed the patient of my clinical impression; the nature and purpose of the treatment or procedure; the risks, benefits, and possible complications of the intervention; the alternatives, including doing nothing; the risk(s) and benefit(s) of the alternative treatment(s) or procedure(s); and the risk(s) and benefit(s) of doing nothing. The patient was provided information about the general risks and possible complications associated with the procedure. These may include, but are not limited to: failure to achieve desired goals, infection, bleeding, organ or nerve damage, allergic reactions, paralysis, and death. In addition, the patient was informed of those risks and complications associated to the procedure, such as failure to decrease pain; infection; bleeding; organ or nerve damage with subsequent damage to sensory, motor, and/or autonomic systems, resulting in permanent pain, numbness, and/or weakness of one or several areas of the body; allergic reactions; (i.e.: anaphylactic reaction); and/or death. Furthermore, the patient was informed of those risks and complications associated with the medications. These include, but are not limited to: allergic reactions (i.e.: anaphylactic or anaphylactoid reaction(s)); adrenal axis suppression; blood sugar elevation that in diabetics may result in ketoacidosis or comma; water retention that in patients with history of congestive heart failure may result in shortness of breath, pulmonary edema, and decompensation with resultant heart failure; weight gain; swelling or  edema; medication-induced neural toxicity; particulate matter embolism and blood vessel occlusion with resultant organ, and/or nervous system infarction; and/or aseptic necrosis of one or more joints. Finally, the patient was informed that Medicine is not an exact science; therefore, there is also the possibility of unforeseen or unpredictable risks and/or possible complications that may result in a catastrophic outcome. The patient indicated having understood very clearly. We have given the patient no guarantees and we have made no promises. Enough time was given to the patient to ask questions, all of which were answered to the patient's satisfaction. Mr. Can has indicated that he wanted to continue with the procedure. Attestation: I, the ordering provider, attest that I have discussed with the patient the benefits, risks, side-effects, alternatives, likelihood of achieving goals, and potential problems during recovery for the procedure that I have provided informed consent. Date   Time: 11/05/2021  9:07 AM  Pre-Procedure Preparation:  Monitoring: As per clinic protocol. Respiration, ETCO2, SpO2, BP, heart rate and rhythm monitor placed and checked for adequate function Safety Precautions: Patient was assessed for positional comfort and pressure points before starting the procedure. Time-out: I initiated and conducted the "Time-out" before starting the procedure, as per protocol. The patient was asked to participate by confirming the accuracy of the "Time Out" information. Verification of the correct person, site, and procedure were performed and confirmed by me, the nursing staff, and the patient. "Time-out" conducted as per Joint Commission's Universal Protocol (UP.01.01.01). Time: 0954  Description/Narrative of Procedure:          Target: Ilioinguinal (IG) nerve  Approach: Percutaneous   Rationale (medical necessity): procedure needed and proper for the diagnosis and/or treatment of the  patient's medical symptoms and needs. Procedural Technique Safety Precautions: Aspiration looking for blood return was conducted prior to all injections. At no point did we inject any substances, as a needle was being advanced. No attempts were made at seeking any paresthesias. Safe injection practices and needle disposal techniques used. Medications properly checked for expiration dates. SDV (single dose vial) medications used. Description of  the Procedure: Protocol guidelines were followed. The patient was assisted into a comfortable position. The target area was identified and the area prepped in the usual manner. Skin & deeper tissues infiltrated with local anesthetic. Appropriate amount of time allowed to pass for local anesthetics to take effect. The procedure needles were then advanced to the target area. Proper needle placement secured. Negative aspiration confirmed. Solution injected in intermittent fashion, asking for systemic symptoms every 0.5cc of injectate. The needles were then removed and the area cleansed, making sure to leave some of the prepping solution back to take advantage of its long term bactericidal properties.  Technical description of procedure:  After the patient has been positioned and the area prepped. To block the ilioinguinal nerve, a point 2 inches medial and 2 inches inferior to the anterior superior iliac spine was then identified and marked.  A 25-gauge, 1.5 inch needle was then advanced and under oblique angle towards the pubic symphysis.  From 5 to 7 mL of preservative-free local anesthetic and steroid solution was injected in a fanlike manner as the needle pierced the fascia of the external oblique muscle.   Care was taken not to place the needle to deep avoiding entering the peritoneal cavity.  After the solution was injected, pressure was applied to the injection site to decrease the incidence of pulsed block ecchymosis and hematoma formation.    Vitals:    11/05/21 0955 11/05/21 1000 11/05/21 1002 11/05/21 1013  BP: (!) 150/94 (!) 140/97 (!) 143/98 134/87  Pulse:      Resp: 16 19 16 14   Temp:      SpO2: 97% 95% 94% 99%  Weight:      Height:         Start Time: 0954 hrs. End Time: 1001 hrs.  Imaging Guidance:          Type of Imaging Technique: None used Indication(s): N/A Exposure Time: No patient exposure Contrast: None used. Fluoroscopic Guidance: N/A Ultrasound Guidance: N/A Interpretation: N/A  Post-operative Assessment:  Post-procedure Vital Signs:  Pulse/HCG Rate: 8588 Temp:  (!) 97.2 F (36.2 C) Resp: 14 BP: 134/87 SpO2: 99 %  EBL: None  Complications: No immediate post-treatment complications observed by team, or reported by patient.  Note: The patient tolerated the entire procedure well. A repeat set of vitals were taken after the procedure and the patient was kept under observation following institutional policy, for this type of procedure. Post-procedural neurological assessment was performed, showing return to baseline, prior to discharge. The patient was provided with post-procedure discharge instructions, including a section on how to identify potential problems. Should any problems arise concerning this procedure, the patient was given instructions to immediately contact us, at any time, without hesitation. In any case, we plan to contact the patient by telephone for a follow-up status report regarding this interventional procedure.  Comments:  No additional relevant information.  Plan of Care  Orders:  Orders Placed This Encounter  Procedures   ILIONINGUINAL NERVE BLOCK    Scheduling Instructions:     Side: Left-sided     Sedation: With Sedation.     Timeframe: Today    Order Specific Question:   Where will this procedure be performed?    Answer:   [facility]   DG [facility] C-ARM 1-60 MIN NO REPORT    Intraoperative interpretation by procedural physician at [facility].     Standing Status:   Standing    Number of Occurrences:   1  Order Specific Question:   Reason for exam:    Answer:   Assistance in needle guidance and placement for procedures requiring needle placement in or near specific anatomical locations not easily accessible without such assistance.   Informed Consent Details: Physician/Practitioner Attestation; Transcribe to consent form and obtain patient signature    Nursing Order: Transcribe to consent form and obtain patient signature. Note: Always confirm laterality of pain with Mr. Harada, before procedure.    Order Specific Question:   Physician/Practitioner attestation of informed consent for procedure/surgical case    Answer:   I, the physician/practitioner, attest that I have discussed with the patient the benefits, risks, side effects, alternatives, likelihood of achieving goals and potential problems during recovery for the procedure that I have provided informed consent.    Order Specific Question:   Procedure    Answer:   Left ilioinguinal nerve block    Order Specific Question:   Physician/Practitioner performing the procedure    Answer:   Nichalos Brenton A. Dossie Arbour, MD    Order Specific Question:   Indication/Reason    Answer:   Left groin pain   Provide equipment / supplies at bedside    "Block Tray" (Disposable   single use) Needle type: SpinalRegular Amount/quantity: 1 Size: Short(1.5-inch) Gauge: 25G    Standing Status:   Standing    Number of Occurrences:   1    Order Specific Question:   Specify    Answer:   Block Tray   Chronic Opioid Analgesic:  Tramadol 50 mg tablet, 1 tab p.o. 3 times daily (last prescribed on 10/16/2021) MME/day: 15 mg/day   Medications ordered for procedure: Meds ordered this encounter  Medications   lidocaine (XYLOCAINE) 2 % (with pres) injection 400 mg   pentafluoroprop-tetrafluoroeth (GEBAUERS) aerosol   lactated ringers infusion 1,000 mL   midazolam (VERSED) 5 MG/5ML injection 0.5-2 mg    Make  sure Flumazenil is available in the pyxis when using this medication. If oversedation occurs, administer 0.2 mg IV over 15 sec. If after 45 sec no response, administer 0.2 mg again over 1 min; may repeat at 1 min intervals; not to exceed 4 doses (1 mg)   triamcinolone acetonide (KENALOG-40) injection 40 mg   ropivacaine (PF) 2 mg/mL (0.2%) (NAROPIN) injection 9 mL   glycopyrrolate (ROBINUL) injection 0.2 mg   Medications administered: We administered lidocaine, lactated ringers, midazolam, triamcinolone acetonide, ropivacaine (PF) 2 mg/mL (0.2%), and glycopyrrolate.  See the medical record for exact dosing, route, and time of administration.  Follow-up plan:   Return in about 2 weeks (around 11/19/2021) for Proc-day (T,Th), (VV), (PPE).        Interventional Therapies  Risk   Complexity Considerations:   Estimated body mass index is 20.73 kg/m as calculated from the following:   Height as of this encounter: 6' 0.5" (1.842 m).   Weight as of this encounter: 155 lb (70.3 kg). ALLERGY: Adhesive tape; shellfish (able to tolerate contrast)   Planned   Pending:   Diagnostic left inguinal nerve block #1    Under consideration:   Diagnostic left inguinal nerve block #1    Completed:   None at this time   Completed by other providers:   Diagnostic left inguinal nerve block under ultrasound-guidance x3 (02/03/2021) by Dr. Hervey Ard. "Area of most pronounced tenderness near the medial aspect of the inguinal incision towards the medial aspect of the inguinal ligament a total of 10 cc of 0.5% Xylocaine with 0.25% Marcaine with 1-200,000 notes of  epinephrine and 4 mg of dexamethasone was injected. After a 10-minute observation. The area was markedly less tender"   Therapeutic   Palliative (PRN) options:   None established     Recent Visits Date Type Provider Dept  11/05/21 Procedure visit Milinda Pointer, MD [facility]  10/28/21 Office Visit Milinda Pointer, MD [facility]  Showing recent visits within past 90 days and meeting all other requirements Future Appointments Date Type Provider Dept  11/19/21 Appointment Milinda Pointer, MD [facility]  Showing future appointments within next 90 days and meeting all other requirements  Disposition: Discharge home  Discharge (Date   Time): 11/05/2021; 1015 hrs.   Primary Care Physician: Albina Billet, MD Location: [facility] Note by: Gaspar Cola, MD Date: 11/05/2021; Time: 8:11 AM  Disclaimer:  Medicine is not an Chief Strategy Officer. The only guarantee in medicine is that nothing is guaranteed. It is important to note that the decision to proceed with this intervention was based on the information collected from the patient. The Data and conclusions were drawn from the patient's questionnaire, the interview, and the physical examination. Because the information was provided in large part by the patient, it cannot be guaranteed that it has not been purposely or unconsciously manipulated. Every effort has been made to obtain as much relevant data as possible for this evaluation. It is important to note that the conclusions that lead to this procedure are derived in large part from the available data. Always take into account that the treatment will also be dependent on availability of resources and existing treatment guidelines, considered by other Pain Management Practitioners as being common knowledge and practice, at the time of the intervention. For Medico-Legal purposes, it is also important to point out that variation in procedural techniques and pharmacological choices are the acceptable norm. The indications, contraindications, technique, and results of the above procedure should only be interpreted and judged by a Board-Certified Interventional Pain Specialist with extensive familiarity and expertise in the same exact procedure and technique.

## 2021-11-06 LAB — VITAMIN B12: Vitamin B-12: 632 pg/mL (ref 232–1245)

## 2021-11-06 LAB — COMP. METABOLIC PANEL (12)
AST: 26 IU/L (ref 0–40)
Albumin/Globulin Ratio: 2.3 — ABNORMAL HIGH (ref 1.2–2.2)
Albumin: 5.1 g/dL — ABNORMAL HIGH (ref 3.8–4.8)
Alkaline Phosphatase: 70 IU/L (ref 44–121)
BUN/Creatinine Ratio: 18 (ref 10–24)
BUN: 22 mg/dL (ref 8–27)
Bilirubin Total: 1.6 mg/dL — ABNORMAL HIGH (ref 0.0–1.2)
Calcium: 10.7 mg/dL — ABNORMAL HIGH (ref 8.6–10.2)
Chloride: 104 mmol/L (ref 96–106)
Creatinine, Ser: 1.24 mg/dL (ref 0.76–1.27)
Globulin, Total: 2.2 g/dL (ref 1.5–4.5)
Glucose: 92 mg/dL (ref 70–99)
Potassium: 4.2 mmol/L (ref 3.5–5.2)
Sodium: 142 mmol/L (ref 134–144)
Total Protein: 7.3 g/dL (ref 6.0–8.5)
eGFR: 65 mL/min/{1.73_m2} (ref >59)

## 2021-11-06 LAB — SEDIMENTATION RATE: Sed Rate: 5 mm/hr (ref 0–30)

## 2021-11-06 LAB — C-REACTIVE PROTEIN: CRP: <1 mg/L (ref 0–10)

## 2021-11-06 LAB — 25-HYDROXY VITAMIN D LCMS D2+D3
25-Hydroxy, Vitamin D-2: <1 ng/mL
25-Hydroxy, Vitamin D-3: 42 ng/mL
25-Hydroxy, Vitamin D: 42 ng/mL

## 2021-11-06 LAB — MAGNESIUM: Magnesium: 2.7 mg/dL — ABNORMAL HIGH (ref 1.6–2.3)

## 2021-11-06 NOTE — Telephone Encounter (Signed)
Called pp.Denies any needs at this time. Instructed to call if needed. ?

## 2021-11-18 NOTE — Progress Notes (Signed)
Patient: Marcus Malone  Service Category: E/M  Provider: Gaspar Cola, MD  DOB: 1956-02-08  DOS: 11/19/2021  Location: Office  MRN: [id number]  Setting: Ambulatory outpatient  Referring Provider: Albina Billet, MD  Type: Established Patient  Specialty: Interventional Pain Management  PCP: Albina Billet, MD  Location: Remote location  Delivery: TeleHealth     Virtual Encounter - Pain Management PROVIDER NOTE: Information contained herein reflects review and annotations entered in association with encounter. Interpretation of such information and data should be left to medically-trained personnel. Information provided to patient can be located elsewhere in the medical record under "Patient Instructions". Document created using STT-dictation technology, any transcriptional errors that may result from process are unintentional.    Contact & Pharmacy Preferred: [phone] Home: [phone] (home) Mobile: [phone] (mobile) E-mail: [email or web address]  [facility] [place], [place] - [address] [place] [address] Phone: [phone] Fax: [phone]  [id number] [address], [address] [place] [address] Phone: [phone] Fax: [phone]   Pre-screening  Marcus Malone offered "in-person" vs "virtual" encounter. He indicated preferring virtual for this encounter.   Reason COVID-19*   Social distancing based on CDC and AMA recommendations.   I contacted Marcus Malone on 11/19/2021 via telephone.      I clearly identified myself as Gaspar Cola, MD. I verified that I was speaking with the correct person using two identifiers (Name: Marcus Malone, and date of birth: [birth year]).  Consent I sought verbal advanced consent from Marcus Malone for virtual visit interactions. I informed Marcus Malone of possible security and privacy concerns,  risks, and limitations associated with providing "not-in-person" medical evaluation and management services. I also informed Marcus Malone of the availability of "in-person" appointments. Finally, I informed him that there would be a charge for the virtual visit and that he could be  personally, fully or partially, financially responsible for it. Marcus Malone expressed understanding and agreed to proceed.   Historic Elements   Marcus Malone is a 66 y.o. year old, male patient evaluated today after our last contact on 11/05/2021. 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 He also  has a past surgical history that includes Skin lesion excision (2011); Colonoscopy (09/11/2010); Colonoscopy with propofol (N/A, 02/04/2016); and Inguinal hernia repair (Left, 11/10/2020). Marcus Malone has a current medication list which includes the following prescription(s): acyclovir, alendronate, alprazolam, amitriptyline, vitamin c, calcium carbonate, clindamycin, diltiazem, folic acid, hydrocortisone, magnesium citrate, meloxicam, methocarbamol, mometasone, multiple vitamins-minerals, omega-3 fatty acids, oxycodone-acetaminophen, promethazine, red yeast rice extract, saw palmetto-phytosterols, sildenafil, sodium fluoride, xeljanz xr, tramadol, triamcinolone cream, zolpidem, and [START ON 12/25/2021] pregabalin. He  reports that he has never smoked. He has never used smokeless tobacco. He reports current alcohol use of about 1.0 standard drink per week. He reports that he does not use drugs. Marcus Malone is allergic to adhesive [tape], bacitracin-polymyxin b, shellfish allergy, and neosporin [neomycin-bacitracin zn-polymyx].   HPI  Today, he is being contacted for a post-procedure assessment.  The patient  indicates having attained 100% relief of the pain for the duration of the local anesthetic followed by a decrease to a 75% improvement.  However, this has continued to  improve to the point that right now he refers having a 75-90% improvement and the only pain that he is having is just some general pain at the incisional site.  He has also been able to tolerate the Lyrica 75 mg capsule.  He continues to be on his titration and he is already at 75 mg twice daily.  He is scheduled to go to the 3 times daily soon and he refers not having any side effects or adverse reactions.  Today I will be sending a prescription for the Lyrica 75 mg capsule to be taken 3 times daily.  I will provide him with enough medication to last for 90 days.  He is still pending to move to [place] and as soon as he gets there I expect them to reevaluate medication and decide whether or not he needs to continue being titrated up.  He is currently at 225 mg/day, but he could continue to increase it up to 450 mg/day assuming that it continues to give him additional benefit and no side effects or adverse reactions.  Today he indicated that he may have to stay a little longer in [place] before moving to [place] and he has asked about the possibility of having the procedure repeated.  He refers doing well right now, but he used to get these injections every 3 months.  I have informed him that we will not put him on the schedule to do injections every 3 months, without knowing that there is a need for it.  I have informed him that should the pain start coming back, no that he would need to do is to give [place] a call and request to come in to have it repeated.  He understood and accepted.  Post-procedure evaluation    Type: Ilioinguinal Nerve Block ([id number]) #1  Laterality: Left (-LT)  Level: ASIS  Imaging: Fluoroscopic guidance ([id number]) Anesthesia: Local anesthesia (1-2% Lidocaine) Anxiolysis: IV Versed 2.0 mg Sedation: None. DOS:  11/05/2021  Performed by: Gaspar Cola, MD  Purpose: Diagnostic/Therapeutic Indications: Groin pain severe enough to impact quality of life or function. 1. Chronic pain of inguinal region (Left)   2. Chronic groin pain (1ry area of Pain) (Left)   3. Chronic postoperative pain (inguinal region) (Left)   4. Chronic pain of left inguinal region   5. Chronic groin pain, left   6. Chronic postoperative pain    NAS-11 Pain score:   Pre-procedure: 2 /10   Post-procedure: 0-No pain/10      Effectiveness:  Initial hour after procedure: 100 %. Subsequent 4-6 hours post-procedure: 100 %. Analgesia past initial 6 hours: 75 % (ongoing). Ongoing improvement:  Analgesic: The patient indicates having an ongoing 75-90% relief of the pain.  He refers only having some discomfort right around the area of the incision, which he says it is about a 1/10. Function: Marcus Malone reports improvement in function ROM: Marcus Malone reports improvement in ROM  Pharmacotherapy Assessment   Opioid Analgesic: Tramadol 50 mg tablet, 1 tab p.o. 3 times daily (last prescribed on 10/16/2021) MME/day: 15 mg/day   Monitoring: [place] PMP: PDMP reviewed during this encounter.       Pharmacotherapy: No side-effects or adverse reactions reported. Compliance: No problems identified. Effectiveness: Clinically acceptable. Plan: Refer to "POC". UDS:  Summary  Date Value Ref Range Status  10/28/2021 Note  Final    Comment:    ==================================================================== Compliance Drug Analysis, Ur ==================================================================== Test  Result       Flag       Units  Drug Present and Declared for Prescription Verification   Tramadol                       117          EXPECTED   ng/mg creat   O-Desmethyltramadol            201          EXPECTED   ng/mg creat    Source of tramadol is a prescription medication.  O-desmethyltramadol    is an expected metabolite of tramadol.    Gabapentin                     PRESENT      EXPECTED   Methocarbamol                  PRESENT      EXPECTED   Diltiazem                      PRESENT      EXPECTED  Drug Absent but Declared for Prescription Verification   Alprazolam                     Not Detected UNEXPECTED ng/mg creat   Oxycodone                      Not Detected UNEXPECTED ng/mg creat   Pregabalin                     Not Detected UNEXPECTED   Zolpidem                       Not Detected UNEXPECTED    Zolpidem, as indicated in the declared medication list, is not    always detected even when used as directed.    Amitriptyline                  Not Detected UNEXPECTED   Acetaminophen                  Not Detected UNEXPECTED    Acetaminophen, as indicated in the declared medication list, is not    always detected even when used as directed.    Promethazine                   Not Detected UNEXPECTED ==================================================================== Test                      Result    Flag   Units      Ref Range   Creatinine              201              mg/dL      >=20 ==================================================================== Declared Medications:  The flagging and interpretation on this report are based on the  following declared medications.  Unexpected results may arise from  inaccuracies in the declared medications.   **Note: The testing scope of this panel includes these medications:   Alprazolam (Xanax)  Amitriptyline (Elavil)  Diltiazem  Gabapentin (Neurontin)  Methocarbamol  Oxycodone (Percocet)  Pregabalin (Lyrica)  Promethazine  Tramadol (Ultram)   **Note: The testing scope of this panel does not include small to  moderate amounts of  these reported medications:   Acetaminophen (Percocet)  Zolpidem (Ambien)   **Note: The testing scope of this panel does not include the  following reported  medications:   Acyclovir (Zovirax)  Alendronate (Fosamax)  Calcium  Clindamycin (Cleocin)  Fluoride  Folic Acid  Magnesium  Meloxicam  Mometasone  Multivitamin  Omega-3 Fatty Acids  Sildenafil (Viagra)  Tofacitinib Morrie Sheldon)  Triamcinolone (Kenalog)  Vitamin C ==================================================================== For clinical consultation, please call [phone]. ====================================================================      Laboratory Chemistry Profile   Renal Lab Results  Component Value Date   BUN 22 10/28/2021   CREATININE 1.24 10/28/2021   BCR 18 10/28/2021   GFRAA >60 02/18/2020   GFRNONAA >60 02/18/2020    Hepatic Lab Results  Component Value Date   AST 26 10/28/2021   ALT 36 02/18/2020   ALBUMIN 5.1 (H) 10/28/2021   ALKPHOS 70 10/28/2021   LIPASE 29 02/18/2020    Electrolytes Lab Results  Component Value Date   NA 142 10/28/2021   K 4.2 10/28/2021   CL 104 10/28/2021   CALCIUM 10.7 (H) 10/28/2021   MG 2.7 (H) 10/28/2021    Bone Lab Results  Component Value Date   25OHVITD1 42 10/28/2021   25OHVITD2 <1.0 10/28/2021   25OHVITD3 42 10/28/2021    Inflammation (CRP: Acute Phase) (ESR: Chronic Phase) Lab Results  Component Value Date   CRP <1 10/28/2021   ESRSEDRATE 5 10/28/2021         Note: Above Lab results reviewed.  Imaging  DG PAIN CLINIC C-ARM 1-60 MIN NO REPORT Fluoro was used, but no Radiologist interpretation will be provided.  Please refer to "NOTES" tab for provider progress note.  Assessment  The primary encounter diagnosis was Chronic pain of inguinal region (Left). Diagnoses of Chronic groin pain (1ry area of Pain) (Left), Chronic postoperative pain (inguinal region) (Left), Left lower quadrant abdominal pain, and Chronic neuropathic pain were also pertinent to this visit.  Plan of Care  Problem-specific:  No problem-specific Assessment & Plan notes found for this encounter.  Marcus Malone has a current medication list which includes the following long-term medication(s): amitriptyline, calcium carbonate, diltiazem, promethazine, sildenafil, xeljanz xr, zolpidem, and [START ON 12/25/2021] pregabalin.  Pharmacotherapy (Medications Ordered): Meds ordered this encounter  Medications   pregabalin (LYRICA) 75 MG capsule    Sig: Take 1 capsule (75 mg total) by mouth 3 (three) times daily.    Dispense:  270 capsule    Refill:  0    Fill one day early if pharmacy is closed on scheduled refill date. May substitute for generic if available.   Orders:  Orders Placed This Encounter  Procedures   ILIONINGUINAL NERVE BLOCK    Standing Status:   Standing    Number of Occurrences:   1    Standing Expiration Date:   03/21/2022    Scheduling Instructions:     Side: Left-sided     Sedation: With Sedation.     Timeframe: PRN Procedure. Patient will call.    Order Specific Question:   Where will this procedure be performed?    Answer:   [facility]   Follow-up plan:   Return if symptoms worsen or fail to improve.     Interventional Therapies  Risk   Complexity Considerations:   Estimated body mass index is 20.73 kg/m as calculated from the following:   Height as of this encounter: 6' 0.5" (1.842 m).   Weight as of this encounter: 155 lb (  70.3 kg). ALLERGY: Adhesive tape; shellfish (able to tolerate contrast)   Planned   Pending:   Pending to move to [facility].  Continue titrating Lyrica up as tolerated.  Currently (11/19/2021) at 75 mg twice daily with a scheduled to move onto 75 mg capsule 3 times daily.   Under consideration:   Diagnostic left inguinal nerve block #2    Completed:   Diagnostic left inguinal nerve block x1 (11/05/2021) (100/100/75/75-90)    Completed by other providers:   Diagnostic left inguinal nerve block under ultrasound-guidance x3 (02/03/2021) by Dr. Hervey Ard. "Area of most pronounced tenderness near the medial aspect of the  inguinal incision towards the medial aspect of the inguinal ligament a total of 10 cc of 0.5% Xylocaine with 0.25% Marcaine with 1-200,000 notes of epinephrine and 4 mg of dexamethasone was injected. After a 10-minute observation. The area was markedly less tender"   Therapeutic   Palliative (PRN) options:   Therapeutic/palliative left inguinal NBs      Recent Visits Date Type Provider Dept  11/05/21 Procedure visit Milinda Pointer, MD [facility]  10/28/21 Office Visit Milinda Pointer, MD [facility]  Showing recent visits within past 90 days and meeting all other requirements Today's Visits Date Type Provider Dept  11/19/21 Office Visit Milinda Pointer, MD [facility]  Showing today's visits and meeting all other requirements Future Appointments No visits were found meeting these conditions. Showing future appointments within next 90 days and meeting all other requirements  I discussed the assessment and treatment plan with the patient. The patient was provided an opportunity to ask questions and all were answered. The patient agreed with the plan and demonstrated an understanding of the instructions.  Patient advised to call back or seek an in-person evaluation if the symptoms or condition worsens.  Duration of encounter: 16 minutes.  Note by: Gaspar Cola, MD Date: 11/19/2021; Time: 12:46 PM

## 2021-11-19 ENCOUNTER — Other Ambulatory Visit: Payer: Self-pay

## 2021-11-19 ENCOUNTER — Ambulatory Visit: Payer: BC Managed Care – PPO | Attending: Pain Medicine | Admitting: Pain Medicine

## 2021-11-19 DIAGNOSIS — G8928 Other chronic postprocedural pain: Secondary | ICD-10-CM | POA: Diagnosis not present

## 2021-11-19 DIAGNOSIS — G8929 Other chronic pain: Secondary | ICD-10-CM

## 2021-11-19 DIAGNOSIS — R1032 Left lower quadrant pain: Secondary | ICD-10-CM | POA: Diagnosis not present

## 2021-11-19 DIAGNOSIS — M792 Neuralgia and neuritis, unspecified: Secondary | ICD-10-CM | POA: Diagnosis not present

## 2021-11-19 MED ORDER — PREGABALIN 75 MG PO CAPS: 75.0000 mg | ORAL_CAPSULE | Freq: Three times a day (TID) | ORAL | 0 refills | Status: AC

## 2021-11-19 NOTE — Patient Instructions (Signed)
______________________________________________________________________ ° °Preparing for Procedure with Sedation ° °NOTICE: Due to recent regulatory changes, starting on April 20, 2021, procedures requiring intravenous (IV) sedation will no longer be performed at the [facility].  These types of procedures are required to be performed at [facility] ambulatory surgery facility.  We are very sorry for the inconvenience. ° °Procedure appointments are limited to planned procedures: °No Prescription Refills. °No disability issues will be discussed. °No medication changes will be discussed. ° °Instructions: °Oral Intake: Do not eat or drink anything for at least 8 hours prior to your procedure. (Exception: Blood Pressure Medication. See below.) °Transportation: A driver is required. You may not drive yourself after the procedure. °Blood Pressure Medicine: Do not forget to take your blood pressure medicine with a sip of water the morning of the procedure. If your Diastolic (lower reading) is above 100 mmHg, elective cases will be cancelled/rescheduled. °Blood thinners: These will need to be stopped for procedures. Notify our staff if you are taking any blood thinners. Depending on which one you take, there will be specific instructions on how and when to stop it. °Diabetics on insulin: Notify the staff so that you can be scheduled 1st case in the morning. If your diabetes requires high dose insulin, take only ½ of your normal insulin dose the morning of the procedure and notify the staff that you have done so. °Preventing infections: Shower with an antibacterial soap the morning of your procedure. °Build-up your immune system: Take 1000 mg of Vitamin C with every meal (3 times a day) the day prior to your procedure. °Antibiotics: Inform the staff if you have a condition or reason that requires you to take antibiotics before dental procedures. °Pregnancy: If you are pregnant, call and cancel the procedure. °Sickness: If  you have a cold, fever, or any active infections, call and cancel the procedure. °Arrival: You must be in the facility at least 30 minutes prior to your scheduled procedure. °Children: Do not bring children with you. °Dress appropriately: Bring dark clothing that you would not mind if they get stained. °Valuables: Do not bring any jewelry or valuables. ° °Reasons to call and reschedule or cancel your procedure: (Following these recommendations will minimize the risk of a serious complication.) °Surgeries: Avoid having procedures within 2 weeks of any surgery. (Avoid for 2 weeks before or after any surgery). °Flu Shots: Avoid having procedures within 2 weeks of a flu shots. (Avoid for 2 weeks before or after immunizations). °Barium: Avoid having a procedure within 7-10 days after having had a radiological study involving the use of radiological contrast. (Myelograms, Barium swallow or enema study). °Heart attacks: Avoid any elective procedures or surgeries for the initial 6 months after a "Myocardial Infarction" (Heart Attack). °Blood thinners: It is imperative that you stop these medications before procedures. Let us know if you if you take any blood thinner.  °Infection: Avoid procedures during or within two weeks of an infection (including chest colds or gastrointestinal problems). Symptoms associated with infections include: Localized redness, fever, chills, night sweats or profuse sweating, burning sensation when voiding, cough, congestion, stuffiness, runny nose, sore throat, diarrhea, nausea, vomiting, cold or Flu symptoms, recent or current infections. It is specially important if the infection is over the area that we intend to treat. °Heart and lung problems: Symptoms that may suggest an active cardiopulmonary problem include: cough, chest pain, breathing difficulties or shortness of breath, dizziness, ankle swelling, uncontrolled high or unusually low blood pressure, and/or palpitations. If you are    experiencing any of these symptoms, cancel your procedure and contact your primary care physician for an evaluation. ° °Remember:  °Regular Business hours are:  °Monday to Thursday 8:00 AM to 4:00 PM ° °Provider's Schedule: °Tremar Wickens, MD:  °Procedure days: Tuesday and Thursday 7:30 AM to 4:00 PM ° °Bilal Lateef, MD:  °Procedure days: Monday and Wednesday 7:30 AM to 4:00 PM °______________________________________________________________________ ° ____________________________________________________________________________________________ ° °General Risks and Possible Complications ° °Patient Responsibilities: It is important that you read this as it is part of your informed consent. It is our duty to inform you of the risks and possible complications associated with treatments offered to you. It is your responsibility as a patient to read this and to ask questions about anything that is not clear or that you believe was not covered in this document. ° °Patient’s Rights: You have the right to refuse treatment. You also have the right to change your mind, even after initially having agreed to have the treatment done. However, under this last option, if you wait until the last second to change your mind, you may be charged for the materials used up to that point. ° °Introduction: Medicine is not an exact science. Everything in Medicine, including the lack of treatment(s), carries the potential for danger, harm, or loss (which is by definition: Risk). In Medicine, a complication is a secondary problem, condition, or disease that can aggravate an already existing one. All treatments carry the risk of possible complications. The fact that a side effects or complications occurs, does not imply that the treatment was conducted incorrectly. It must be clearly understood that these can happen even when everything is done following the highest safety standards. ° °No treatment: You can choose not to proceed with the  proposed treatment alternative. The “PRO(s)” would include: avoiding the risk of complications associated with the therapy. The “CON(s)” would include: not getting any of the treatment benefits. These benefits fall under one of three categories: diagnostic; therapeutic; and/or palliative. Diagnostic benefits include: getting information which can ultimately lead to improvement of the disease or symptom(s). Therapeutic benefits are those associated with the successful treatment of the disease. Finally, palliative benefits are those related to the decrease of the primary symptoms, without necessarily curing the condition (example: decreasing the pain from a flare-up of a chronic condition, such as incurable terminal cancer). ° °General Risks and Complications: These are associated to most interventional treatments. They can occur alone, or in combination. They fall under one of the following six (6) categories: no benefit or worsening of symptoms; bleeding; infection; nerve damage; allergic reactions; and/or death. °No benefits or worsening of symptoms: In Medicine there are no guarantees, only probabilities. No healthcare provider can ever guarantee that a medical treatment will work, they can only state the probability that it may. Furthermore, there is always the possibility that the condition may worsen, either directly, or indirectly, as a consequence of the treatment. °Bleeding: This is more common if the patient is taking a blood thinner, either prescription or over the counter (example: Goody Powders, Fish oil, Aspirin, Garlic, etc.), or if suffering a condition associated with impaired coagulation (example: Hemophilia, cirrhosis of the liver, low platelet counts, etc.). However, even if you do not have one on these, it can still happen. If you have any of these conditions, or take one of these drugs, make sure to notify your treating physician. °Infection: This is more common in patients with a compromised  immune system, either due to disease (example:   diabetes, cancer, human immunodeficiency virus [HIV], etc.), or due to medications or treatments (example: therapies used to treat cancer and rheumatological diseases). However, even if you do not have one on these, it can still happen. If you have any of these conditions, or take one of these drugs, make sure to notify your treating physician. °Nerve Damage: This is more common when the treatment is an invasive one, but it can also happen with the use of medications, such as those used in the treatment of cancer. The damage can occur to small secondary nerves, or to large primary ones, such as those in the spinal cord and brain. This damage may be temporary or permanent and it may lead to impairments that can range from temporary numbness to permanent paralysis and/or brain death. °Allergic Reactions: Any time a substance or material comes in contact with our body, there is the possibility of an allergic reaction. These can range from a mild skin rash (contact dermatitis) to a severe systemic reaction (anaphylactic reaction), which can result in death. °Death: In general, any medical intervention can result in death, most of the time due to an unforeseen complication. °____________________________________________________________________________________________  °

## 2021-12-10 ENCOUNTER — Encounter: Payer: Self-pay | Admitting: Dermatology

## 2022-01-24 NOTE — Progress Notes (Signed)
PROVIDER NOTE: Interpretation of information contained herein should be left to medically-trained personnel. Specific patient instructions are provided elsewhere under "Patient Instructions" section of medical record. This document was created in part using STT-dictation technology, any transcriptional errors that may result from this process are unintentional.  ?Patient: Marcus Malone ?Type: Established ?DOB: 22-Jul-1956 ?MRN: [id number] ?PCP: Albina Billet, MD  Service: Procedure ?DOS: 01/28/2022 ?Setting: Ambulatory ?Location: Ambulatory outpatient facility ?Delivery: Face-to-face Provider: Gaspar Cola, MD ?Specialty: Interventional Pain Management ?Specialty designation: 09 ?Location: Outpatient facility ?Ref. Prov.: Albina Billet, MD   ? ?Primary Reason for Visit: Interventional Pain Management Treatment. ?CC: Pain (Private area) ?  ?Procedure:          ? Type: Ilioinguinal Nerve Block ([id number]) #2 and left herniorrhaphy scar infiltration ?Laterality: Left (-LT)  ?Level: ASIS  ?Imaging: Fluoroscopic guidance ([id number]) ?Anesthesia: Local anesthesia (1-2% Lidocaine) ?Anxiolysis: IV Versed 2.0 mg ?Sedation: None. ?DOS: 01/28/2022  ?Performed by: Gaspar Cola, MD ? ?Purpose: Diagnostic/Therapeutic ?Indications: Groin pain severe enough to impact quality of life or function. ?1. Chronic groin pain (1ry area of Pain) (Left)   ?2. Chronic neuropathic pain   ?3. Chronic pain of inguinal region (Left)   ?4. Chronic postoperative pain (inguinal region) (Left)   ? ?NAS-11 Pain score:  ? Pre-procedure: 2 /10  ? Post-procedure: 0-No pain/10  ? ?  ?Position / Prep / Materials:  ?Position: Supine with a pillow under the knees.  ?Prep solution: DuraPrep (Iodine Povacrylex [0.7% available iodine] and Isopropyl Alcohol, 74% w/w) ?Prep Area: Entire lower abdominal area from transumbilical plane (L3-9 disc level) down to public tubercle/symphysis pubis level; and from flank to flank, making sure to cover the  anterior superior iliac spine (ASIS) area.  ?Materials:  ?Tray: Block tray ?Needle(s):  ?Type: Regular needle  ?Gauge (G): 25-G  ?Length: 1.5-in  ?Qty: 1 ? ?Pre-op H&P Assessment:  ?Mr. Marcus Malone is a 66 y.o. (year old), male patient, seen today for interventional treatment. He  has a past surgical history that includes Skin lesion excision (2011); Colonoscopy (09/11/2010); Colonoscopy with propofol (N/A, 02/04/2016); and Inguinal hernia repair (Left, 11/10/2020). Mr. Marcus Malone has a current medication list which includes the following prescription(s): acyclovir, alendronate, alprazolam, amitriptyline, vitamin c, calcium carbonate, clindamycin, diltiazem, folic acid, hydrocortisone, magnesium citrate, meloxicam, methocarbamol, minoxidil, mometasone, multiple vitamins-minerals, omega-3 fatty acids, oxycodone-acetaminophen, pregabalin, promethazine, red yeast rice extract, saw palmetto-phytosterols, sildenafil, sodium fluoride, xeljanz xr, tramadol, triamcinolone cream, and zolpidem. His primarily concern today is the Pain (Private area) ? ?Initial Vital Signs:  ?Pulse/HCG Rate: 63ECG Heart Rate: 72 ?Temp: (!) 97.3 ?F (36.3 ?C) ?Resp: 16 ?BP: (!) 144/93 ?SpO2: 100 % ? ?BMI: Estimated body mass index is 21.4 kg/m? as calculated from the following: ?  Height as of this encounter: 6' 0.5" (1.842 m). ?  Weight as of this encounter: 160 lb (72.6 kg). ? ?Risk Assessment: ?Allergies: Reviewed. He is allergic to adhesive [tape], bacitracin-polymyxin b, shellfish allergy, and neosporin [neomycin-bacitracin zn-polymyx].  ?Allergy Precautions: None required ?Coagulopathies: Reviewed. None identified.  ?Blood-thinner therapy: None at this time ?Active Infection(s): Reviewed. None identified. Mr. Marcus Malone is afebrile ? ?Site Confirmation: Mr. Marcus Malone was asked to confirm the procedure and laterality before marking the site ?Procedure checklist: Completed ?Consent: Before the procedure and under the influence of no sedative(s),  amnesic(s), or anxiolytics, the patient was informed of the treatment options, risks and possible complications. To fulfill our ethical and legal obligations, as recommended by the American Medical Association's Code of Ethics, I have  informed the patient of my clinical impression; the nature and purpose of the treatment or procedure; the risks, benefits, and possible complications of the intervention; the alternatives, including doing nothing; the risk(s) and benefit(s) of the alternative treatment(s) or procedure(s); and the risk(s) and benefit(s) of doing nothing. ?The patient was provided information about the general risks and possible complications associated with the procedure. These may include, but are not limited to: failure to achieve desired goals, infection, bleeding, organ or nerve damage, allergic reactions, paralysis, and death. ?In addition, the patient was informed of those risks and complications associated to the procedure, such as failure to decrease pain; infection; bleeding; organ or nerve damage with subsequent damage to sensory, motor, and/or autonomic systems, resulting in permanent pain, numbness, and/or weakness of one or several areas of the body; allergic reactions; (i.e.: anaphylactic reaction); and/or death. ?Furthermore, the patient was informed of those risks and complications associated with the medications. These include, but are not limited to: allergic reactions (i.e.: anaphylactic or anaphylactoid reaction(s)); adrenal axis suppression; blood sugar elevation that in diabetics may result in ketoacidosis or comma; water retention that in patients with history of congestive heart failure may result in shortness of breath, pulmonary edema, and decompensation with resultant heart failure; weight gain; swelling or edema; medication-induced neural toxicity; particulate matter embolism and blood vessel occlusion with resultant organ, and/or nervous system infarction; and/or aseptic  necrosis of one or more joints. ?Finally, the patient was informed that Medicine is not an exact science; therefore, there is also the possibility of unforeseen or unpredictable risks and/or possible complications that may result in a catastrophic outcome. The patient indicated having understood very clearly. We have given the patient no guarantees and we have made no promises. Enough time was given to the patient to ask questions, all of which were answered to the patient's satisfaction. Mr. Hayashi has indicated that he wanted to continue with the procedure. ?Attestation: I, the ordering provider, attest that I have discussed with the patient the benefits, risks, side-effects, alternatives, likelihood of achieving goals, and potential problems during recovery for the procedure that I have provided informed consent. ?Date  Time: 01/28/2022  8:25 AM ? ?Pre-Procedure Preparation:  ?Monitoring: As per clinic protocol. Respiration, ETCO2, SpO2, BP, heart rate and rhythm monitor placed and checked for adequate function ?Safety Precautions: Patient was assessed for positional comfort and pressure points before starting the procedure. ?Time-out: I initiated and conducted the "Time-out" before starting the procedure, as per protocol. The patient was asked to participate by confirming the accuracy of the "Time Out" information. Verification of the correct person, site, and procedure were performed and confirmed by me, the nursing staff, and the patient. "Time-out" conducted as per Joint Commission's Universal Protocol (UP.01.01.01). ?Time: 1194 ? ?Description/Narrative of Procedure:          ?Target: Ilioinguinal (IG) nerve  ?Approach: Percutaneous  ? ?Rationale (medical necessity): procedure needed and proper for the diagnosis and/or treatment of the patient's medical symptoms and needs. ?Procedural Technique Safety Precautions: Aspiration looking for blood return was conducted prior to all injections. At no point did we  inject any substances, as a needle was being advanced. No attempts were made at seeking any paresthesias. Safe injection practices and needle disposal techniques used. Medications properly checked for expiration dates.

## 2022-01-26 ENCOUNTER — Ambulatory Visit: Payer: BC Managed Care – PPO | Admitting: Dermatology

## 2022-01-26 ENCOUNTER — Encounter: Payer: Self-pay | Admitting: Dermatology

## 2022-01-26 DIAGNOSIS — Z85828 Personal history of other malignant neoplasm of skin: Secondary | ICD-10-CM

## 2022-01-26 DIAGNOSIS — Z86018 Personal history of other benign neoplasm: Secondary | ICD-10-CM | POA: Diagnosis not present

## 2022-01-26 DIAGNOSIS — L570 Actinic keratosis: Secondary | ICD-10-CM | POA: Diagnosis not present

## 2022-01-26 DIAGNOSIS — L820 Inflamed seborrheic keratosis: Secondary | ICD-10-CM | POA: Diagnosis not present

## 2022-01-26 DIAGNOSIS — D1800 Hemangioma unspecified site: Secondary | ICD-10-CM

## 2022-01-26 DIAGNOSIS — L649 Androgenic alopecia, unspecified: Secondary | ICD-10-CM

## 2022-01-26 DIAGNOSIS — D492 Neoplasm of unspecified behavior of bone, soft tissue, and skin: Secondary | ICD-10-CM

## 2022-01-26 DIAGNOSIS — D2271 Melanocytic nevi of right lower limb, including hip: Secondary | ICD-10-CM | POA: Diagnosis not present

## 2022-01-26 DIAGNOSIS — L814 Other melanin hyperpigmentation: Secondary | ICD-10-CM

## 2022-01-26 DIAGNOSIS — L578 Other skin changes due to chronic exposure to nonionizing radiation: Secondary | ICD-10-CM

## 2022-01-26 DIAGNOSIS — L821 Other seborrheic keratosis: Secondary | ICD-10-CM

## 2022-01-26 DIAGNOSIS — Z1283 Encounter for screening for malignant neoplasm of skin: Secondary | ICD-10-CM

## 2022-01-26 DIAGNOSIS — D229 Melanocytic nevi, unspecified: Secondary | ICD-10-CM

## 2022-01-26 MED ORDER — MINOXIDIL 2.5 MG PO TABS: 2.5000 mg | ORAL_TABLET | Freq: Every day | ORAL | 6 refills | Status: AC

## 2022-01-26 NOTE — Progress Notes (Signed)
Follow-Up Visit   Subjective  Marcus Malone is a 66 y.o. male who presents for the following: Annual Exam (Skin cancer screening. Full body. Hx BCC's, Hx of dysplastic nevi, Hx AK's. Patient has a list with areas of concern. Patient reports he is moving to [place] soon). The patient presents for Total-Body Skin Exam (TBSE) for skin cancer screening and mole check.  The patient has spots, moles and lesions to be evaluated, some may be new or changing and the patient has concerns that these could be cancer.  The following portions of the chart were reviewed this encounter and updated as appropriate:  Tobacco  Allergies  Meds  Problems  Med Hx  Surg Hx  Fam Hx     Review of Systems: No other skin or systemic complaints except as noted in HPI or Assessment and Plan.  Objective  Well appearing patient in no apparent distress; mood and affect are within normal limits.  A full examination was performed including scalp, head, eyes, ears, nose, lips, neck, chest, axillae, abdomen, back, buttocks, bilateral upper extremities, bilateral lower extremities, hands, feet, fingers, toes, fingernails, and toenails. All findings within normal limits unless otherwise noted below.  Left infra-axillary  x1, left temple 1, right preauricular x1, scalp x4, chest x1, neck x4, legs x10 (22) Erythematous keratotic or waxy stuck-on papule or plaque.  face x7 (7) Erythematous thin papules/macules with gritty scale.   Scalp Diffuse thinning of hair at vertex or temples.   right lateral mid pretibial 0.3 cm dark brown macule        Assessment & Plan   History of Basal Cell Carcinoma of the Skin. L superior lateral deltoid, 2006; Left mid to upper back paraspinal 09/03/2020. - No evidence of recurrence today - Recommend regular full body skin exams - Recommend daily broad spectrum sunscreen SPF 30+ to sun-exposed areas, reapply every 2 hours as needed.  - Call if any new or changing  lesions are noted between office visits   History of Dysplastic Nevi. Right upper back, mild, 2011; left ant med deltoid, 2011; left flank at the inferior side, moderate, 03/06/2020. - No evidence of recurrence today - Recommend regular full body skin exams - Recommend daily broad spectrum sunscreen SPF 30+ to sun-exposed areas, reapply every 2 hours as needed.  - Call if any new or changing lesions are noted between office visits   Lentigines - Scattered tan macules - Due to sun exposure - Benign-appearing, observe - Recommend daily broad spectrum sunscreen SPF 30+ to sun-exposed areas, reapply every 2 hours as needed. - Call for any changes  Seborrheic Keratoses - Stuck-on, waxy, tan-brown papules and/or plaques  - Benign-appearing - Discussed benign etiology and prognosis. - Observe - Call for any changes  Melanocytic Nevi - Tan-brown and/or pink-flesh-colored symmetric macules and papules - Benign appearing on exam today - Observation - Call clinic for new or changing moles - Recommend daily use of broad spectrum spf 30+ sunscreen to sun-exposed areas.   Hemangiomas - Red papules - Discussed benign nature - Observe - Call for any changes  Actinic Damage - Chronic condition, secondary to cumulative UV/sun exposure - diffuse scaly erythematous macules with underlying dyspigmentation - Recommend daily broad spectrum sunscreen SPF 30+ to sun-exposed areas, reapply every 2 hours as needed.  - Staying in the shade or wearing long sleeves, sun glasses (UVA+UVB protection) and wide brim hats (4-inch brim around the entire circumference of the hat) are also recommended for sun protection.  -  Call for new or changing lesions.  Skin cancer screening performed today.  Inflamed seborrheic keratosis (22) Left infra-axillary  x1, left temple 1, right preauricular x1, scalp x4, chest x1, neck x4, legs x10 Irritated.  Patient would like treatment. Destruction of lesion - Left  infra-axillary  x1, left temple 1, right preauricular x1, scalp x4, chest x1, neck x4, legs x10 Complexity: simple   Destruction method: cryotherapy   Informed consent: discussed and consent obtained   Timeout:  patient name, date of birth, surgical site, and procedure verified Lesion destroyed using liquid nitrogen: Yes   Region frozen until ice ball extended beyond lesion: Yes   Outcome: patient tolerated procedure well with no complications   Post-procedure details: wound care instructions given    AK (actinic keratosis) (7) face x7 Actinic keratoses are precancerous spots that appear secondary to cumulative UV radiation exposure/sun exposure over time. They are chronic with expected duration over 1 year. A portion of actinic keratoses will progress to squamous cell carcinoma of the skin. It is not possible to reliably predict which spots will progress to skin cancer and so treatment is recommended to prevent development of skin cancer.  Recommend daily broad spectrum sunscreen SPF 30+ to sun-exposed areas, reapply every 2 hours as needed.  Recommend staying in the shade or wearing long sleeves, sun glasses (UVA+UVB protection) and wide brim hats (4-inch brim around the entire circumference of the hat). Call for new or changing lesions.  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  Male pattern alopecia is mediated by dihydrotestosterone which induces  miniaturization of androgen-sensitive hair follicles.  It is chronic and persistent, but treatable; not curable. Topical treatment includes: - 5% topical Minoxidil Oral treatment includes: - Finasteride 1 mg qd - Minoxidil 1.25 - 5 mg qd - Dutasteride 0.5 mg qd Adjunct therapy includes: - Low Level Laser Light Therapy (LLLT) - Platelet-rich Plasma injections (PRP) - Hair Transplantation or scalp reduction   Start Minoxidil 2.5 mg daily. Patient will follow-up with new dermatologist in [place].   minoxidil (LONITEN) 2.5 MG tablet - Scalp Take 1 tablet (2.5 mg total) by mouth daily.  Neoplasm of skin right lateral mid pretibial Epidermal / dermal shaving  Lesion diameter (cm):  0.3 Informed consent: discussed and consent obtained   Timeout: patient name, date of birth, surgical site, and procedure verified   Procedure prep:  Patient was prepped and draped in usual sterile fashion Prep type:  Isopropyl alcohol Anesthesia: the lesion was anesthetized in a standard fashion   Anesthetic:  1% lidocaine w/ epinephrine 1-100,000 buffered w/ 8.4% NaHCO3 Instrument used: flexible razor blade   Hemostasis achieved with: pressure, aluminum chloride and electrodesiccation   Outcome: patient tolerated procedure well   Post-procedure details: sterile dressing applied and wound care instructions given   Dressing type: bandage and petrolatum    Specimen 1 - Surgical pathology Differential Diagnosis: Nevus, R/O dysplasia Check Margins: Yes  Return if symptoms worsen or fail to improve. Patient is  moving to [place] in the next few days and will follow-up with dermatologist there.  I, Emelia Salisbury, CMA, am acting as scribe for Sarina Ser, MD. Documentation: I have reviewed the above documentation for accuracy and completeness, and I agree with the above.  Sarina Ser, MD

## 2022-01-26 NOTE — Patient Instructions (Addendum)
Wound Care Instructions ? ?Cleanse wound gently with soap and water once a day then pat dry with clean gauze. Apply a thing coat of Petrolatum (petroleum jelly, "Vaseline") over the wound (unless you have an allergy to this). We recommend that you use a new, sterile tube of Vaseline. Do not pick or remove scabs. Do not remove the yellow or white "healing tissue" from the base of the wound. ? ?Cover the wound with fresh, clean, nonstick gauze and secure with paper tape. You may use Band-Aids in place of gauze and tape if the would is small enough, but would recommend trimming much of the tape off as there is often too much. Sometimes Band-Aids can irritate the skin. ? ?You should call the office for your biopsy report after 1 week if you have not already been contacted. ? ?If you experience any problems, such as abnormal amounts of bleeding, swelling, significant bruising, significant pain, or evidence of infection, please call the office immediately. ? ?FOR ADULT SURGERY PATIENTS: If you need something for pain relief you may take 1 extra strength Tylenol (acetaminophen) AND 2 Ibuprofen ('200mg'$  each) together every 4 hours as needed for pain. (do not take these if you are allergic to them or if you have a reason you should not take them.) Typically, you may only need pain medication for 1 to 3 days.  ? ? ? ? ?Cryotherapy Aftercare ? ?Wash gently with soap and water everyday.   ?Apply Vaseline and Band-Aid daily until healed.  ? ?Prior to procedure, discussed risks of blister formation, small wound, skin dyspigmentation, or rare scar following cryotherapy. Recommend Vaseline ointment to treated areas while healing.  ? ? ?Start Minoxidil 2.5 mg daily.  ? ? Androgenetic Alopecia (or Male pattern hair loss) refers to the common patterned hair loss affecting many men.  Male pattern alopecia is mediated by dihydrotestosterone which induces miniaturization of androgen-sensitive hair follicles.  It is chronic and persistent,  but treatable; not curable. ?Topical treatment includes: ?- 5% topical Minoxidil ?Oral treatment includes: ?- Finasteride 1 mg qd ?- Minoxidil 1.25 - 5 mg qd ?- Dutasteride 0.5 mg qd ?Adjunct therapy includes: ?- Low Level Laser Light Therapy (LLLT) ?- Platelet-rich Plasma injections (PRP) ?- Hair Transplantation or scalp reduction  ? ? ? ?Recommend daily broad spectrum sunscreen SPF 30+ to sun-exposed areas, reapply every 2 hours as needed. Call for new or changing lesions.  ?Staying in the shade or wearing long sleeves, sun glasses (UVA+UVB protection) and wide brim hats (4-inch brim around the entire circumference of the hat) are also recommended for sun protection.  ? ? ?Melanoma ABCDEs ? ?Melanoma is the most dangerous type of skin cancer, and is the leading cause of death from skin disease.  You are more likely to develop melanoma if you: ?Have light-colored skin, light-colored eyes, or red or blond hair ?Spend a lot of time in the sun ?Tan regularly, either outdoors or in a tanning bed ?Have had blistering sunburns, especially during childhood ?Have a close family member who has had a melanoma ?Have atypical moles or large birthmarks ? ?Early detection of melanoma is key since treatment is typically straightforward and cure rates are extremely high if we catch it early.  ? ?The first sign of melanoma is often a change in a mole or a new dark spot.  The ABCDE system is a way of remembering the signs of melanoma. ? ?A for asymmetry:  The two halves do not match. ?B for border:  The edges of the growth are irregular. ?C for color:  A mixture of colors are present instead of an even brown color. ?D for diameter:  Melanomas are usually (but not always) greater than 49m - the size of a pencil eraser. ?E for evolution:  The spot keeps changing in size, shape, and color. ? ?Please check your skin once per month between visits. You can use a small mirror in front and a large mirror behind you to keep an eye on the  back side or your body.  ? ?If you see any new or changing lesions before your next follow-up, please call to schedule a visit. ? ?Please continue daily skin protection including broad spectrum sunscreen SPF 30+ to sun-exposed areas, reapplying every 2 hours as needed when you're outdoors.  ? ?Staying in the shade or wearing long sleeves, sun glasses (UVA+UVB protection) and wide brim hats (4-inch brim around the entire circumference of the hat) are also recommended for sun protection.   ? ? ?If You Need Anything After Your Visit ? ?If you have any questions or concerns for your doctor, please call our main line at [phone] press option 4 to reach your doctor's medical assistant. If no one answers, please leave a voicemail as directed and we will return your call as soon as possible. Messages left after 4 pm will be answered the following business day.  ? ?You may also send [place] message via MyChart. We typically respond to MyChart messages within 1-2 business days. ? ?For prescription refills, please ask your pharmacy to contact our office. Our fax number is [phone] ? ?If you have an urgent issue when the clinic is closed that cannot wait until the next business day, you can page your doctor at the number below.   ? ?Please note that while we do our best to be available for urgent issues outside of office hours, we are not available 24/7.  ? ?If you have an urgent issue and are unable to reach [place] you may choose to seek medical care at your doctor's office, retail clinic, urgent care center, or emergency room. ? ?If you have a medical emergency, please immediately call 911 or go to the emergency department. ? ?Pager Numbers ? ?- Dr. KNehemiah Massed [phone]? ?- Dr. MLaurence Ferrari [phone]? ?- Dr. SNicole Kindred [phone]? ?In the event of inclement weather, please call our main line at [phone] an update on the status of any delays or closures. ? ?Dermatology Medication Tips: ?Please keep the boxes  that topical medications come in in order to help keep track of the instructions about where and how to use these. Pharmacies typically print the medication instructions only on the boxes and not directly on the medication tubes.  ? ?If your medication is too expensive, please contact our office at [phone] 4 or send [place] message through [facility]  ? ?We are unable to tell what your co-pay for medications will be in advance as this is different depending on your insurance coverage. However, we may be able to find a substitute medication at lower cost or fill out paperwork to get insurance to cover a needed medication.  ? ?If a prior authorization is required to get your medication covered by your insurance company, please allow [place]-2 business days to complete this process. ? ?Drug prices often vary depending on where the prescription is filled and some pharmacies may offer cheaper prices. ? ?The website www.goodrx.com contains coupons for medications  through different pharmacies. The prices here do not account for what the cost may be with help from insurance (it may be cheaper with your insurance), but the website can give you the price if you did not use any insurance.  ?- You can print the associated coupon and take it with your prescription to the pharmacy.  ?- You may also stop by our office during regular business hours and pick up a GoodRx coupon card.  ?- If you need your prescription sent electronically to a different pharmacy, notify our office through [facility] or by phone at [phone] option 4. ? ? ? ? ?Si Usted Necesita Algo Despu?s de Su Visita ? ?Tambi?n puede enviarnos un mensaje a trav?s de MyChart. Por lo general respondemos a los mensajes de MyChart en el transcurso de 1 a 2 d?as h?biles. ? ?Para renovar recetas, por favor pida a su farmacia que se ponga en contacto con nuestra oficina. Nuestro n?mero de fax es el [phone]. ? ?Si tiene un asunto urgente cuando la  cl?nica est? cerrada y que no puede esperar hasta el siguiente d?a h?bil, puede llamar/localizar a su doctor(a) al n?mero que aparece a continuaci?n.  ? ?Por favor, tenga en cuenta que aunque hacemos todo lo po

## 2022-01-28 ENCOUNTER — Ambulatory Visit (HOSPITAL_BASED_OUTPATIENT_CLINIC_OR_DEPARTMENT_OTHER): Payer: BC Managed Care – PPO | Admitting: Pain Medicine

## 2022-01-28 ENCOUNTER — Encounter: Payer: Self-pay | Admitting: Pain Medicine

## 2022-01-28 ENCOUNTER — Ambulatory Visit
Admission: RE | Admit: 2022-01-28 | Discharge: 2022-01-28 | Disposition: A | Discharge status: Home / Self Care | Payer: BC Managed Care – PPO | Source: Ambulatory Visit | Attending: Pain Medicine | Admitting: Pain Medicine

## 2022-01-28 VITALS — BP 151/91 | HR 63 | Temp 97.3°F | Resp 16 | Ht 72.5 in | Wt 160.0 lb

## 2022-01-28 DIAGNOSIS — M792 Neuralgia and neuritis, unspecified: Secondary | ICD-10-CM | POA: Insufficient documentation

## 2022-01-28 DIAGNOSIS — G8928 Other chronic postprocedural pain: Secondary | ICD-10-CM | POA: Insufficient documentation

## 2022-01-28 DIAGNOSIS — G8929 Other chronic pain: Secondary | ICD-10-CM | POA: Insufficient documentation

## 2022-01-28 DIAGNOSIS — R1032 Left lower quadrant pain: Secondary | ICD-10-CM | POA: Diagnosis present

## 2022-01-28 MED ORDER — TRIAMCINOLONE ACETONIDE 40 MG/ML IJ SUSP
40.0000 mg | Freq: Once | INTRAMUSCULAR | Status: AC
  Administered 2022-01-28: 40 mg
  Filled 2022-01-28: qty 1

## 2022-01-28 MED ORDER — ROPIVACAINE HCL 2 MG/ML IJ SOLN
9.0000 mL | Freq: Once | INTRAMUSCULAR | Status: AC
  Administered 2022-01-28: 9 mL via INTRA_ARTICULAR
  Filled 2022-01-28: qty 20

## 2022-01-28 MED ORDER — LACTATED RINGERS IV SOLN
1000.0000 mL | Freq: Once | INTRAVENOUS | Status: AC
  Administered 2022-01-28: 1000 mL via INTRAVENOUS

## 2022-01-28 MED ORDER — MIDAZOLAM HCL 5 MG/5ML IJ SOLN
0.5000 mg | Freq: Once | INTRAMUSCULAR | Status: AC
  Administered 2022-01-28: 2 mg via INTRAVENOUS
  Filled 2022-01-28: qty 5

## 2022-01-28 MED ORDER — GLYCOPYRROLATE 0.2 MG/ML IJ SOLN
0.2000 mg | Freq: Once | INTRAMUSCULAR | Status: AC
  Administered 2022-01-28: 0.2 mg via INTRAVENOUS
  Filled 2022-01-28: qty 1

## 2022-01-28 MED ORDER — LIDOCAINE HCL 2 % IJ SOLN
20.0000 mL | Freq: Once | INTRAMUSCULAR | Status: AC
  Administered 2022-01-28: 400 mg
  Filled 2022-01-28: qty 40

## 2022-01-28 MED ORDER — DIPHENHYDRAMINE HCL 50 MG/ML IJ SOLN
12.5000 mg | Freq: Once | INTRAMUSCULAR | Status: AC
  Administered 2022-01-28: 50 mg via INTRAVENOUS
  Filled 2022-01-28: qty 1

## 2022-01-28 NOTE — Progress Notes (Signed)
Safety precautions to be maintained throughout the outpatient stay will include: orient to surroundings, keep bed in low position, maintain call bell within reach at all times, provide assistance with transfer out of bed and ambulation.  

## 2022-01-28 NOTE — Patient Instructions (Signed)

## 2022-01-29 ENCOUNTER — Telehealth: Payer: Self-pay | Admitting: *Deleted

## 2022-01-29 NOTE — Telephone Encounter (Signed)
Post procedure call;  patient reports that he is doing very well today and pain is decreased.  ?

## 2022-02-02 ENCOUNTER — Telehealth: Payer: Self-pay

## 2022-02-02 NOTE — Telephone Encounter (Signed)
Patient reviewed results on Mychart. He had no follow up questions about his results when I called him.  ?

## 2022-02-02 NOTE — Telephone Encounter (Signed)
-----   Message from Ralene Bathe, MD sent at 02/01/2022  6:30 PM EDT ----- ?Diagnosis ?Skin , right lateral mid pretibial ?DYSPLASTIC NEVUS WITH MODERATE TO SEVERE ATYPIA, CLOSE TO MARGIN, SEE DESCRIPTION ? ?Moderate to severe dysplastic ?Margins clear but "close to margin" ?This area needs re-evaluated by at least 6 mos and if evidence of recurrence, needs additional procedure.  Can also consider prophylactic additional procedure (such as excision or wider, deeper shave removal with margin check) for "peace of mind". ?Since patient has moved to [place], will have him follow up with Dermatologist there for re-check. ?

## 2022-02-09 ENCOUNTER — Encounter: Payer: Self-pay | Admitting: Dermatology

## 2022-02-22 ENCOUNTER — Ambulatory Visit: Payer: BC Managed Care – PPO | Attending: Pain Medicine | Admitting: Pain Medicine

## 2022-02-22 DIAGNOSIS — R1032 Left lower quadrant pain: Secondary | ICD-10-CM | POA: Diagnosis not present

## 2022-02-22 DIAGNOSIS — R1084 Generalized abdominal pain: Secondary | ICD-10-CM

## 2022-02-22 DIAGNOSIS — M792 Neuralgia and neuritis, unspecified: Secondary | ICD-10-CM | POA: Diagnosis not present

## 2022-02-22 DIAGNOSIS — M545 Low back pain, unspecified: Secondary | ICD-10-CM

## 2022-02-22 DIAGNOSIS — G894 Chronic pain syndrome: Secondary | ICD-10-CM

## 2022-02-22 DIAGNOSIS — M25561 Pain in right knee: Secondary | ICD-10-CM

## 2022-02-22 DIAGNOSIS — G8929 Other chronic pain: Secondary | ICD-10-CM

## 2022-02-22 DIAGNOSIS — G8928 Other chronic postprocedural pain: Secondary | ICD-10-CM

## 2022-02-22 NOTE — Progress Notes (Signed)
Patient: Marcus Malone  Service Category: E/M  Provider: Gaspar Cola, MD  DOB: [birth year]  DOS: 02/22/2022  Location: Office  MRN: [id number]  Setting: Ambulatory outpatient  Referring Provider: Albina Billet, MD  Type: Established Patient  Specialty: Interventional Pain Management  PCP: Marcus Billet, MD  Location: Remote location  Delivery: TeleHealth     Virtual Encounter - Pain Management PROVIDER NOTE: Information contained herein reflects review and annotations entered in association with encounter. Interpretation of such information and data should be left to medically-trained personnel. Information provided to patient can be located elsewhere in the medical record under "Patient Instructions". Document created using STT-dictation technology, any transcriptional errors that may result from process are unintentional.    Contact & Pharmacy Preferred: [phone] Home: [phone] (home) Mobile: [phone] (mobile) E-mail: [email or web address]  [facility] [place], [place] - [address] [place] [address] Phone: [phone] Fax: [phone]  [id number] Marcus Malone, [address] [place] [address] Phone: [phone] Fax: [phone]   Pre-screening  Marcus Malone offered "in-person" vs "virtual" encounter. He indicated preferring virtual for this encounter.   Reason COVID-19*  Social distancing based on CDC and AMA recommendations.   I contacted Marcus Malone on 02/22/2022 via telephone.      I clearly identified myself as Marcus Cola, MD. I verified that I was speaking with the correct person using two identifiers (Name: Marcus Malone, and date of birth: [birth year]).  Consent I sought verbal advanced consent from Marcus Malone for virtual visit interactions. I informed Marcus Malone of possible security and privacy concerns,  risks, and limitations associated with providing "not-in-person" medical evaluation and management services. I also informed Marcus Malone of the availability of "in-person" appointments. Finally, I informed him that there would be a charge for the virtual visit and that he could be  personally, fully or partially, financially responsible for it. Marcus Malone expressed understanding and agreed to proceed.   Historic Elements   Marcus Malone is a 66 y.o. year old, male patient evaluated today after our last contact on 01/28/2022. 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 He also  has a past surgical history that includes Skin lesion excision (2011); Colonoscopy (09/11/2010); Colonoscopy with propofol (N/A, 02/04/2016); and Inguinal hernia repair (Left, 11/10/2020). Marcus Malone has a current medication list which includes the following prescription(s): acyclovir, alendronate, alprazolam, amitriptyline, vitamin c, calcium carbonate, clindamycin, diltiazem, folic acid, hydrocortisone, magnesium citrate, meloxicam, methocarbamol, minoxidil, mometasone, multiple vitamins-minerals, omega-3 fatty acids, oxycodone-acetaminophen, pregabalin, promethazine, red yeast rice extract, saw palmetto-phytosterols, sildenafil, sodium fluoride, xeljanz xr, tramadol, triamcinolone cream, and zolpidem. He  reports that he has never smoked. He has never used smokeless tobacco. He reports current alcohol use of about 1.0 standard drink per week. He reports that he does not use drugs. Marcus Malone is allergic to adhesive [tape], bacitracin-polymyxin b, shellfish allergy, and neosporin [neomycin-bacitracin zn-polymyx].   HPI  Today, he is being  contacted for a post-procedure assessment.  The patient indicates having attained 100% relief of the pain with the left ilioinguinal nerve block and the scar infiltration, at least for the duration of the  local anesthetic.  Once the local anesthetic started wearing off, some of the pain began to return.  However, he indicates that without that block he would have not been able to withstand the pain from the drive going from [place] all the way to [place].  Today I spoke to him on the phone and he indicated that he still getting 100% relief of the pain that he used to have when he was walking and therefore he is very thankful about that.  He has not secured a pain specialist in the area at this point, but he indicates that he is working on it.  I have reminded him that once he gets established, and he gets a pain specialist, all they need to do is just send a signed release of information to [place], and we would be more than happy to transfer his medical information to them.  He understood and accepted.  He refers that for the time being he does not need anything else from me.  Post-procedure evaluation   Type: Ilioinguinal Nerve Block ([id number]) #2 and left herniorrhaphy scar infiltration Laterality: Left (-LT)  Level: ASIS  Imaging: Fluoroscopic guidance ([id number]) Anesthesia: Local anesthesia (1-2% Lidocaine) Anxiolysis: IV Versed 2.0 mg Sedation: None. DOS: 01/28/2022  Performed by: Marcus Cola, MD  Purpose: Diagnostic/Therapeutic Indications: Groin pain severe enough to impact quality of life or function. 1. Chronic groin pain (1ry area of Pain) (Left)   2. Chronic neuropathic pain   3. Chronic pain of inguinal region (Left)   4. Chronic postoperative pain (inguinal region) (Left)    NAS-11 Pain score:   Pre-procedure: 2 /10   Post-procedure: 0-No pain/10     Effectiveness:  Initial hour after procedure: 100 %. Subsequent 4-6 hours post-procedure: 100 %. Analgesia past  initial 6 hours: 25 % (patient residing in [place] now.  states he would not have been able to make that drive without this injection.). Ongoing improvement:  Analgesic: The patient indicates attaining 100% relief of the pain for the duration of the local anesthetic which eventually started wearing off.  However, he was very thankful for the treatment and he indicated that without it he doubts that he would have been able to drive all the way from [place] to [place].  He also refers that currently he is still enjoying 100% relief of the pain that he used to get when he was walking.  He still has not been able to secure a pain physician in [place], but he is working on that. Function: Marcus Malone reports improvement in function ROM: Marcus Malone reports improvement in ROM  Pharmacotherapy Assessment   Opioid Analgesic: Tramadol 50 mg tablet, 1 tab p.o. 3 times daily (last prescribed on 10/16/2021) MME/day: 15 mg/day   Monitoring: [place] PMP: PDMP reviewed during this encounter.       Pharmacotherapy: No side-effects or adverse reactions reported. Compliance: No problems identified. Effectiveness: Clinically acceptable. Plan: Refer to "POC". UDS:  Summary  Date Value Ref Range Status  10/28/2021 Note  Final    Comment:    ==================================================================== Compliance Drug Analysis, Ur ==================================================================== Test                             Result       Flag       Units  Drug Present and Declared for Prescription Verification   Tramadol  117          EXPECTED   ng/mg creat   O-Desmethyltramadol            201          EXPECTED   ng/mg creat    Source of tramadol is a prescription medication. O-desmethyltramadol    is an expected metabolite of tramadol.    Gabapentin                     PRESENT      EXPECTED   Methocarbamol                  PRESENT      EXPECTED   Diltiazem                       PRESENT      EXPECTED  Drug Absent but Declared for Prescription Verification   Alprazolam                     Not Detected UNEXPECTED ng/mg creat   Oxycodone                      Not Detected UNEXPECTED ng/mg creat   Pregabalin                     Not Detected UNEXPECTED   Zolpidem                       Not Detected UNEXPECTED    Zolpidem, as indicated in the declared medication list, is not    always detected even when used as directed.    Amitriptyline                  Not Detected UNEXPECTED   Acetaminophen                  Not Detected UNEXPECTED    Acetaminophen, as indicated in the declared medication list, is not    always detected even when used as directed.    Promethazine                   Not Detected UNEXPECTED ==================================================================== Test                      Result    Flag   Units      Ref Range   Creatinine              201              mg/dL      >=20 ==================================================================== Declared Medications:  The flagging and interpretation on this report are based on the  following declared medications.  Unexpected results may arise from  inaccuracies in the declared medications.   **Note: The testing scope of this panel includes these medications:   Alprazolam (Xanax)  Amitriptyline (Elavil)  Diltiazem  Gabapentin (Neurontin)  Methocarbamol  Oxycodone (Percocet)  Pregabalin (Lyrica)  Promethazine  Tramadol (Ultram)   **Note: The testing scope of this panel does not include small to  moderate amounts of these reported medications:   Acetaminophen (Percocet)  Zolpidem (Ambien)   **Note: The testing scope of this panel does not include the  following reported medications:   Acyclovir (Zovirax)  Alendronate (Fosamax)  Calcium  Clindamycin (Cleocin)  Fluoride  Folic Acid  Magnesium  Meloxicam  Mometasone  Multivitamin  Omega-3 Fatty Acids  Sildenafil (Viagra)   Tofacitinib Morrie [place])  Triamcinolone (Kenalog)  Vitamin C ==================================================================== For clinical consultation, please call [phone]. ====================================================================      Laboratory Chemistry Profile   Renal Lab Results  Component Value Date   BUN 22 10/28/2021   CREATININE 1.24 10/28/2021   BCR 18 10/28/2021   GFRAA >60 02/18/2020   GFRNONAA >60 02/18/2020    Hepatic Lab Results  Component Value Date   AST 26 10/28/2021   ALT 36 02/18/2020   ALBUMIN 5.1 (H) 10/28/2021   ALKPHOS 70 10/28/2021   LIPASE 29 02/18/2020    Electrolytes Lab Results  Component Value Date   NA 142 10/28/2021   K 4.2 10/28/2021   CL 104 10/28/2021   CALCIUM 10.7 (H) 10/28/2021   MG 2.7 (H) 10/28/2021    Bone Lab Results  Component Value Date   25OHVITD1 42 10/28/2021   25OHVITD2 <1.0 10/28/2021   25OHVITD3 42 10/28/2021    Inflammation (CRP: Acute Phase) (ESR: Chronic Phase) Lab Results  Component Value Date   CRP <1 10/28/2021   ESRSEDRATE 5 10/28/2021         Note: Above Lab results reviewed.  Imaging  DG PAIN CLINIC C-ARM 1-60 MIN NO REPORT Fluoro was used, but no Radiologist interpretation will be provided.  Please refer to "NOTES" tab for provider progress note.  Assessment  The primary encounter diagnosis was Chronic groin pain (1ry area of Pain) (Left). Diagnoses of Chronic neuropathic pain, Chronic pain of inguinal region (Left), Chronic postoperative pain (inguinal region) (Left), Left lower quadrant abdominal pain, Chronic low back pain (2ry area of Pain) (Left) w/o sciatica, Chronic knee pain (3ry area of Pain) (Right), Generalized abdominal pain, and Chronic pain syndrome were also pertinent to this visit.  Plan of Care  Problem-specific:  No problem-specific Assessment & Plan notes found for this encounter.  Marcus Malone has a current medication list which includes the  following long-term medication(s): amitriptyline, calcium carbonate, diltiazem, minoxidil, pregabalin, promethazine, sildenafil, xeljanz xr, and zolpidem.  Pharmacotherapy (Medications Ordered): No orders of the defined types were placed in this encounter.  Orders:  No orders of the defined types were placed in this encounter.  Follow-up plan:   Return if symptoms worsen or fail to improve.     Interventional Therapies  Risk  Complexity Considerations:   Estimated body mass index is 20.73 kg/m as calculated from the following:   Height as of this encounter: 6' 0.5" (1.842 m).   Weight as of this encounter: 155 lb (70.3 kg). ALLERGY: Adhesive tape; shellfish (able to tolerate contrast)   Planned  Pending:   Pending to move to [facility].  Continue titrating Lyrica up as tolerated.  Currently (11/19/2021) at 75 mg twice daily with a scheduled to move onto 75 mg capsule 3 times daily.   Under consideration:      Completed:   Diagnostic left inguinal nerve block x2 (01/28/2022) (100/100/75/75-90) (100/100/25/100)    Completed by other providers:   Diagnostic left inguinal nerve block under ultrasound-guidance x3 (02/03/2021) by Dr. Hervey Ard. "Area of most pronounced tenderness near the medial aspect of the inguinal incision towards the medial aspect of the inguinal ligament a total of 10 cc of 0.5% Xylocaine with 0.25% Marcaine with 1-200,000 notes of epinephrine and 4 mg of dexamethasone was injected. After a 10-minute observation. The area was markedly less tender"   Therapeutic  Palliative (PRN) options:   Therapeutic/palliative left  inguinal NBs      Recent Visits Date Type Provider Dept  01/28/22 Procedure visit Milinda Pointer, MD [facility]  Showing recent visits within past 90 days and meeting all other requirements Today's Visits Date Type Provider Dept  02/22/22 Office Visit Milinda Pointer, MD [facility]  Showing today's visits  and meeting all other requirements Future Appointments No visits were found meeting these conditions. Showing future appointments within next 90 days and meeting all other requirements  I discussed the assessment and treatment plan with the patient. The patient was provided an opportunity to ask questions and all were answered. The patient agreed with the plan and demonstrated an understanding of the instructions.  Patient advised to call back or seek an in-person evaluation if the symptoms or condition worsens.  Duration of encounter: 14 minutes.  Note by: Marcus Cola, MD Date: 02/22/2022; Time: 12:04 PM

## 2022-08-12 IMAGING — CR DG LUMBAR SPINE 2-3V
1 series · 2 of 2 positions shown · non-contrast
Comparison: CT abdomen pelvis 02/18/2020

CLINICAL DATA: Back pain week. Unable to move 6 days ago.

EXAM:
LUMBAR SPINE - 2-3 VIEW

[Series 1: dg lumbar spine 2-3 views · 0.14mm/px · 2 of 2 slices shown]
[im 1/2]
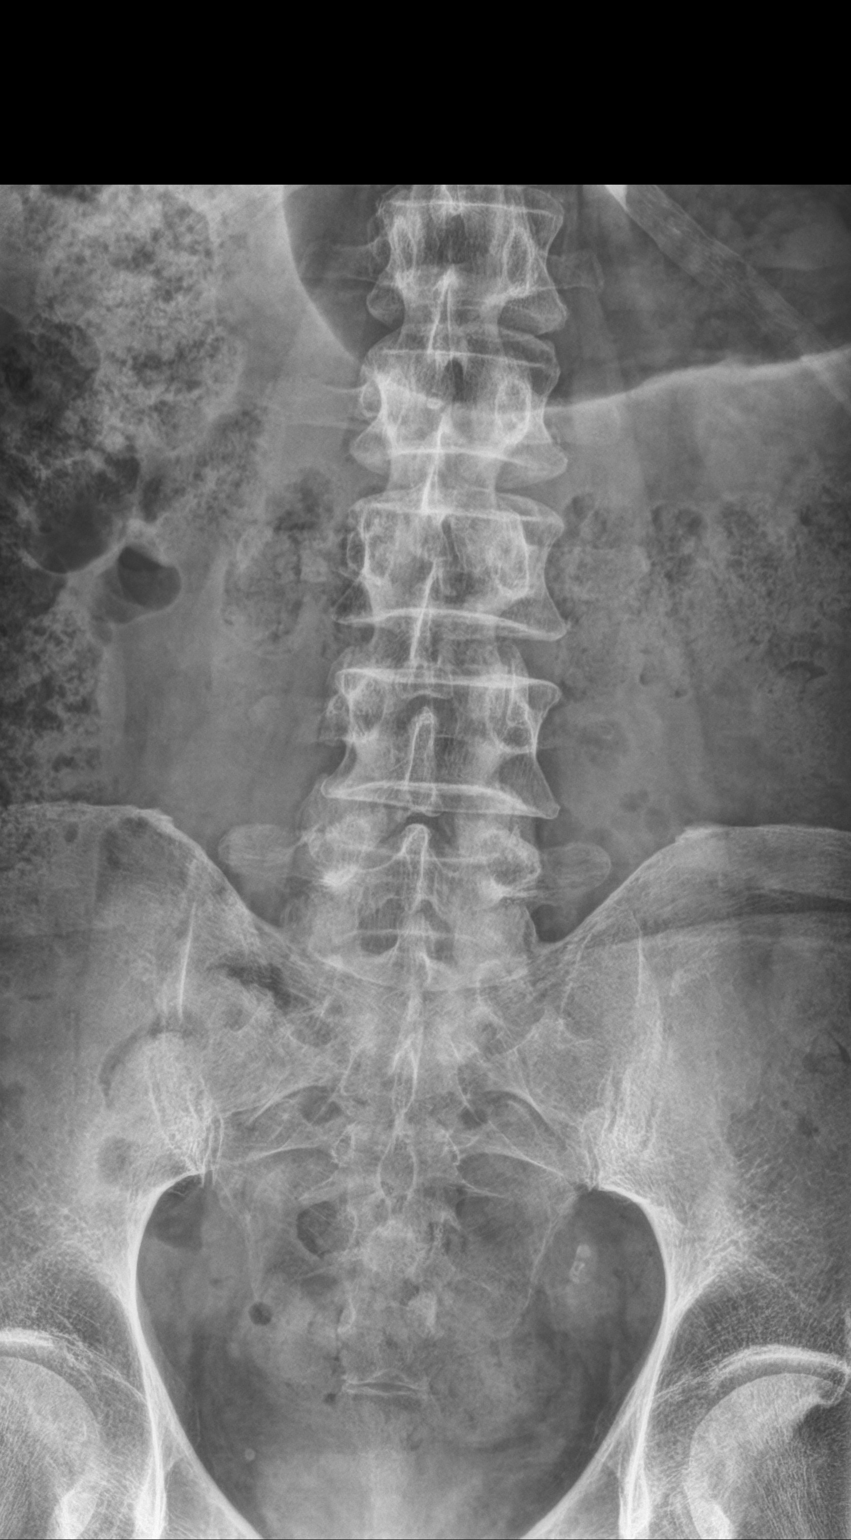
[im 2/2]
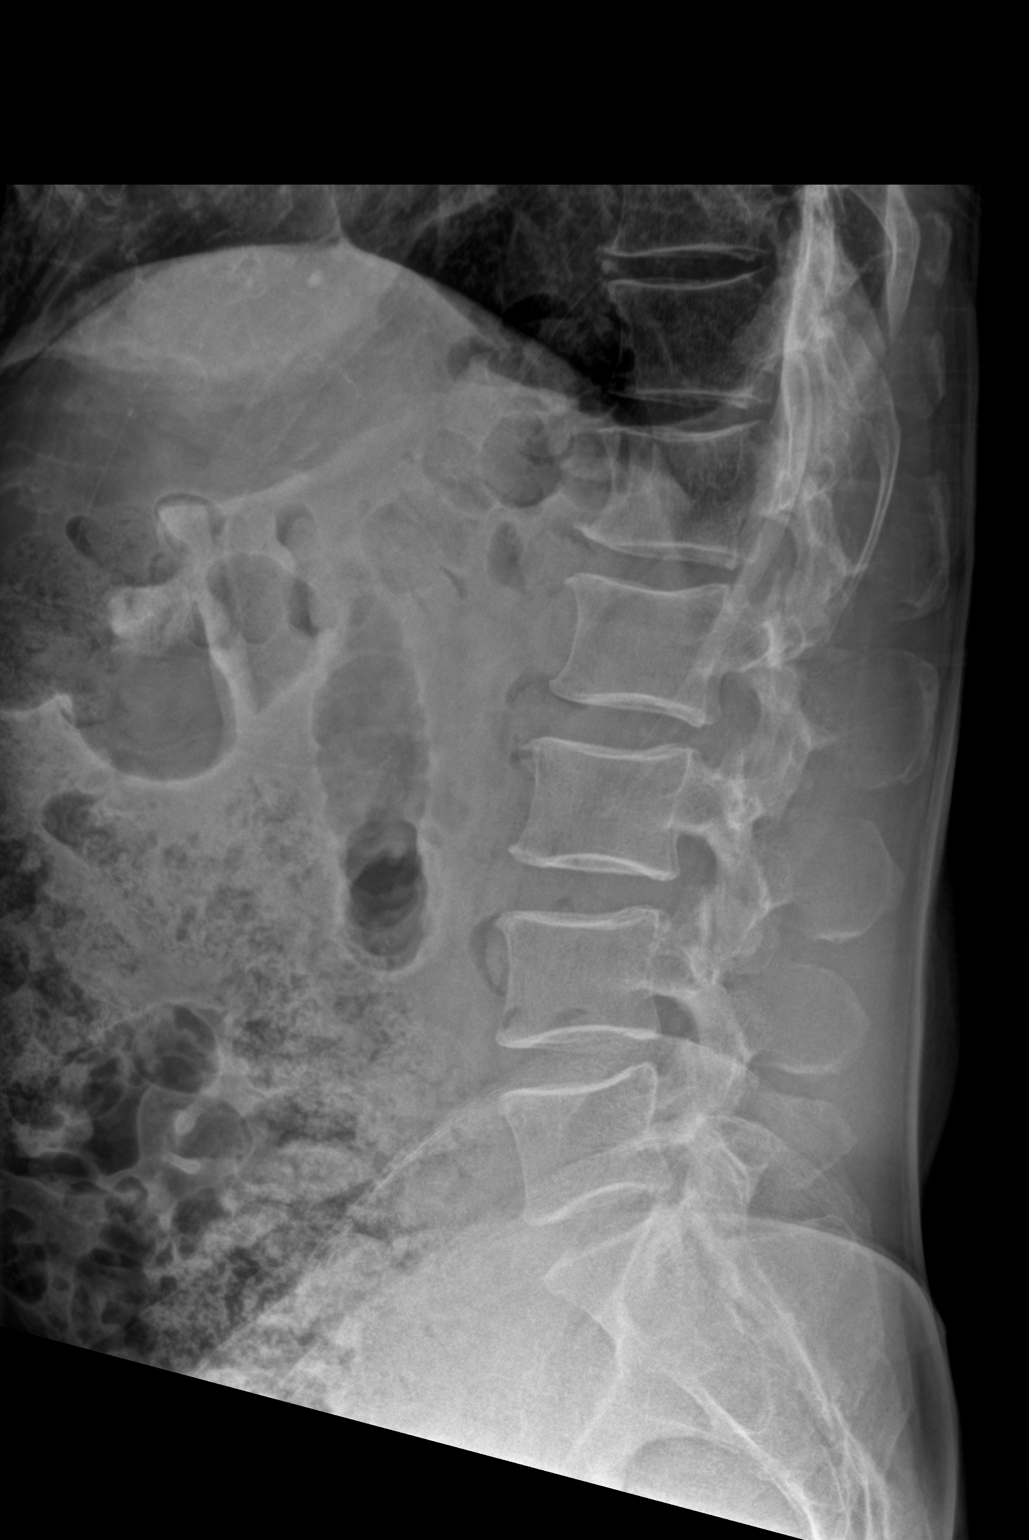

[2 of 2 positions shown; findings below may reference images not displayed]

FINDINGS: Five non rib-bearing lumbar type vertebral bodies are present.
Vertebral body heights and alignment normal. Straightening of the
normal lumbar lordosis is noted. Bone mineralization within limits.
Moderate stool is present throughout the colon.
IMPRESSION: No acute or focal abnormality of the lumbar spine.

## 2022-11-09 IMAGING — CR DG KNEE COMPLETE 4+V*R*
1 series · 4 of 4 positions shown · non-contrast
Comparison: None.

CLINICAL DATA: Right knee pain/arthralgia.

EXAM:
RIGHT KNEE - COMPLETE 4+ VIEW

[Series 1: dg knee complete 4 views right · 0.14mm/px · 4 of 4 slices shown]
[im 1/4]
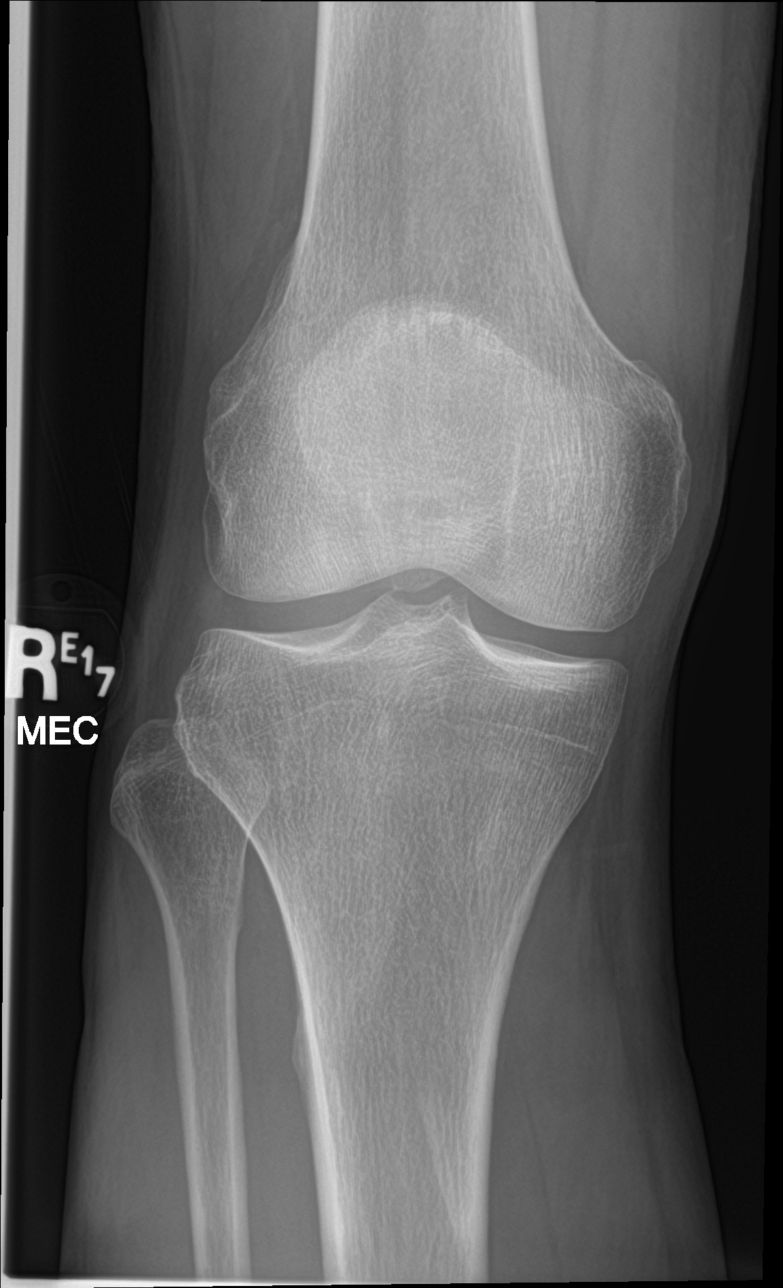
[im 2/4]
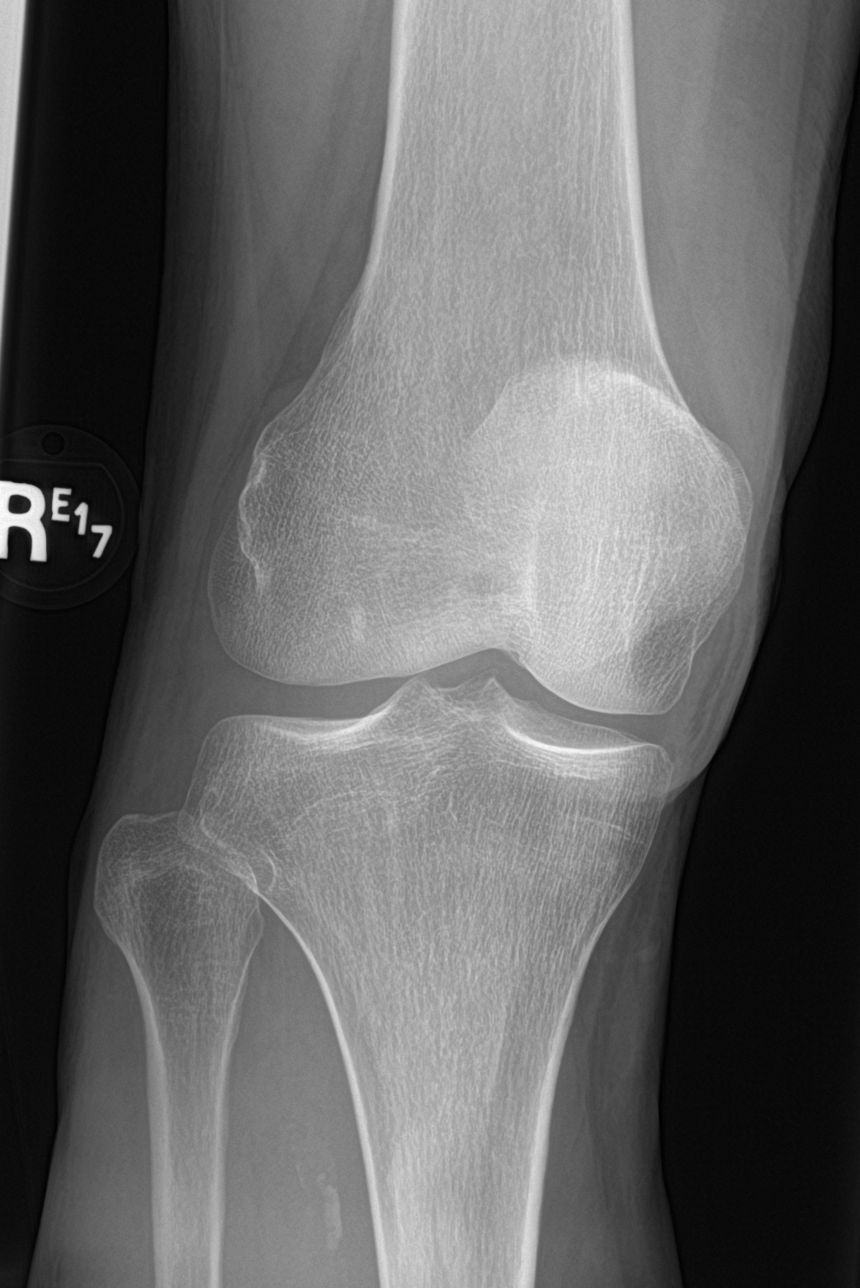
[im 3/4]
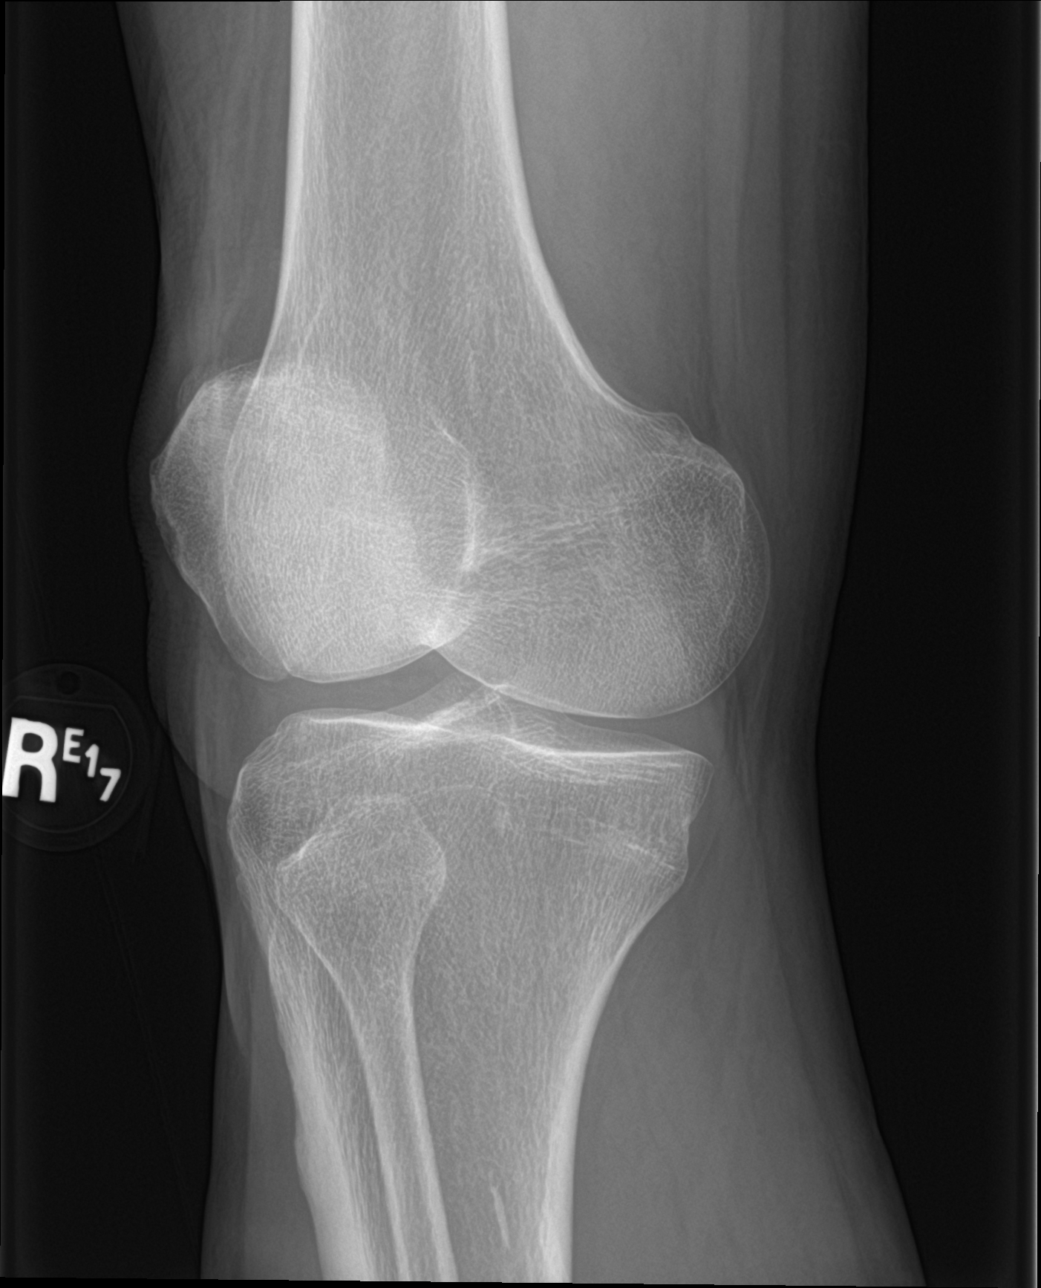
[im 4/4]
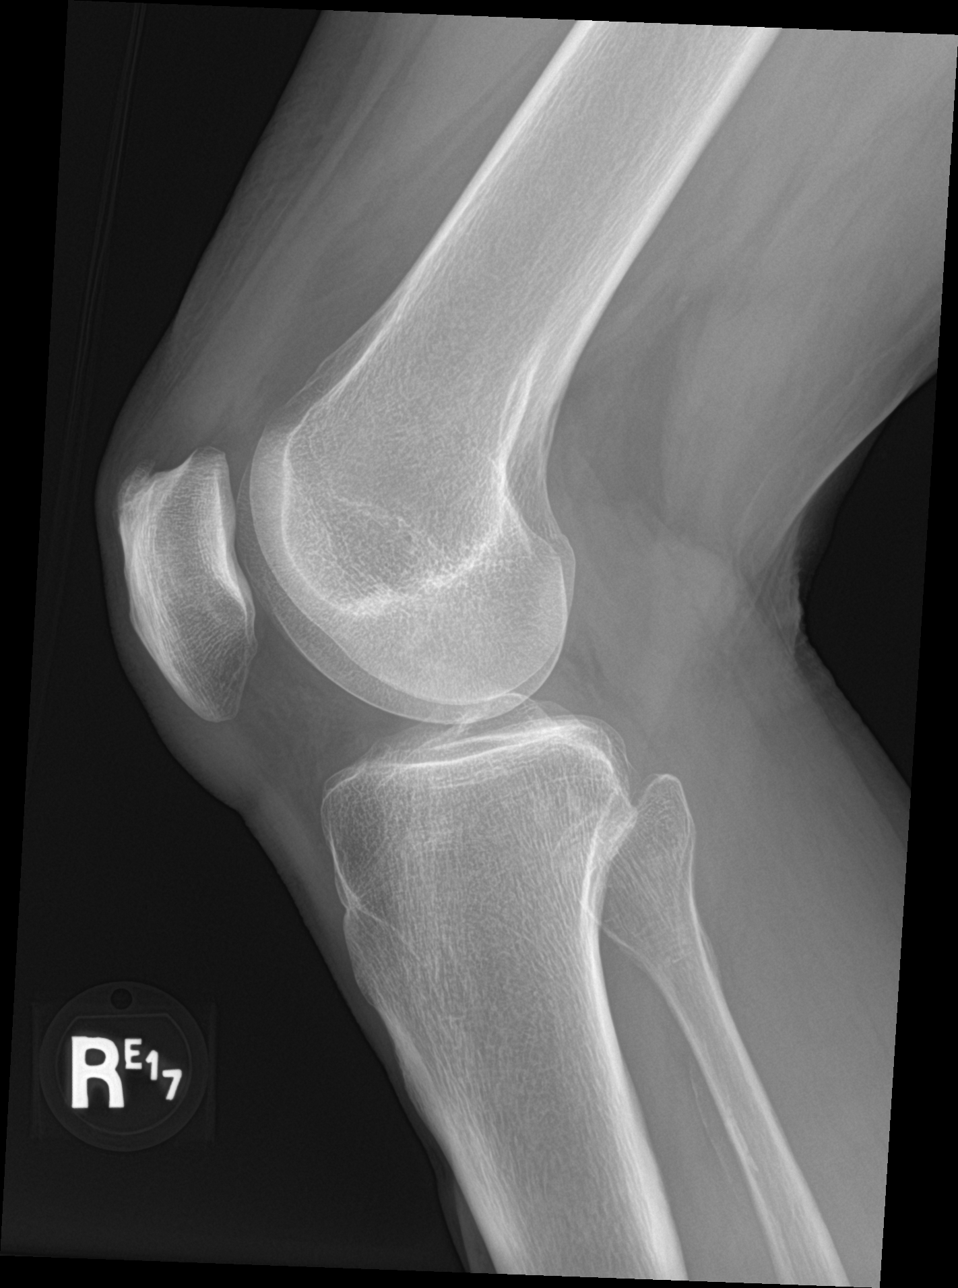

[4 of 4 positions shown; findings below may reference images not displayed]

FINDINGS: Normal bone mineralization. Joint spaces are preserved. No joint
effusion. Minimal chronic enthesopathic change at the quadriceps
insertion of the patella. No acute fracture or dislocation.
IMPRESSION: No significant degenerative change. Mild enthesopathic change at the
distal quadriceps tendon insertion.

## 2022-11-09 IMAGING — CR DG LUMBAR SPINE COMPLETE W/ BEND
1 series · 7 of 7 positions shown · non-contrast
Comparison: Lumbar spine radiograph dated 07/31/2021.

CLINICAL DATA: Low back pain.

EXAM:
LUMBAR SPINE - COMPLETE WITH BENDING VIEWS

[Series 1: dg lumbar spine complete w/bend 6+v · 0.14mm/px · 7 of 7 slices shown]
[im 1/7]
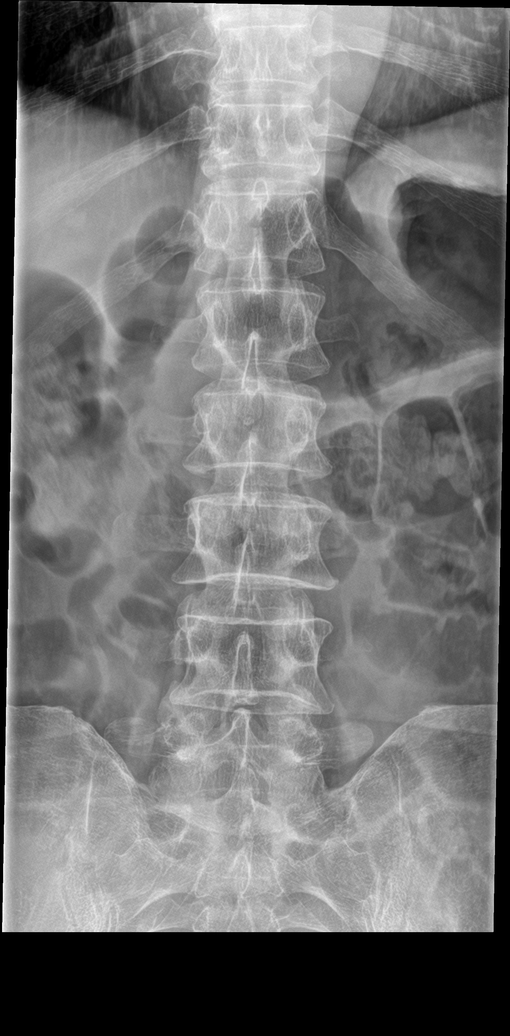
[im 2/7]
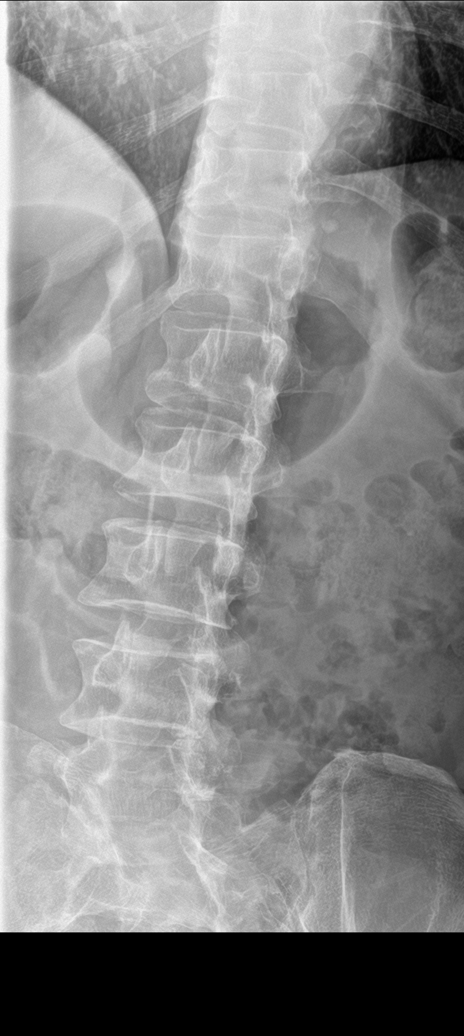
[im 3/7]
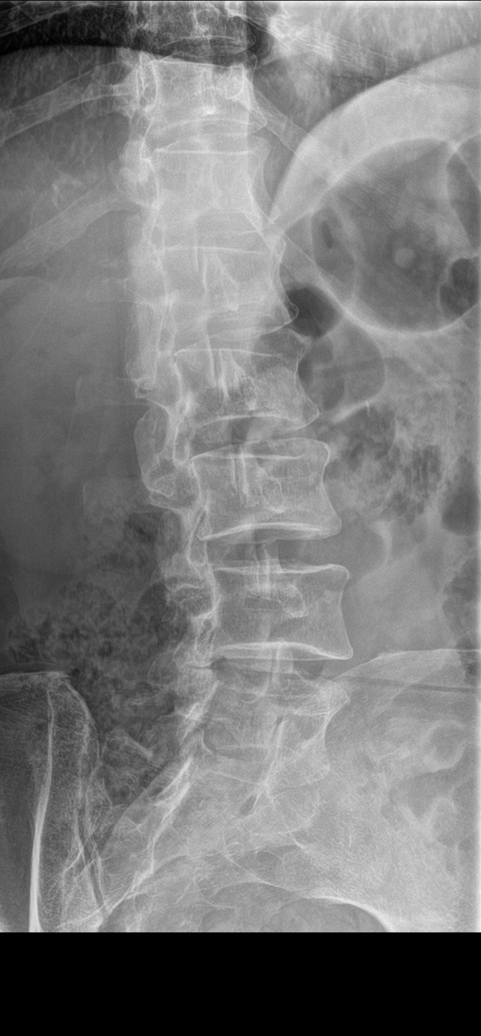
[im 4/7]
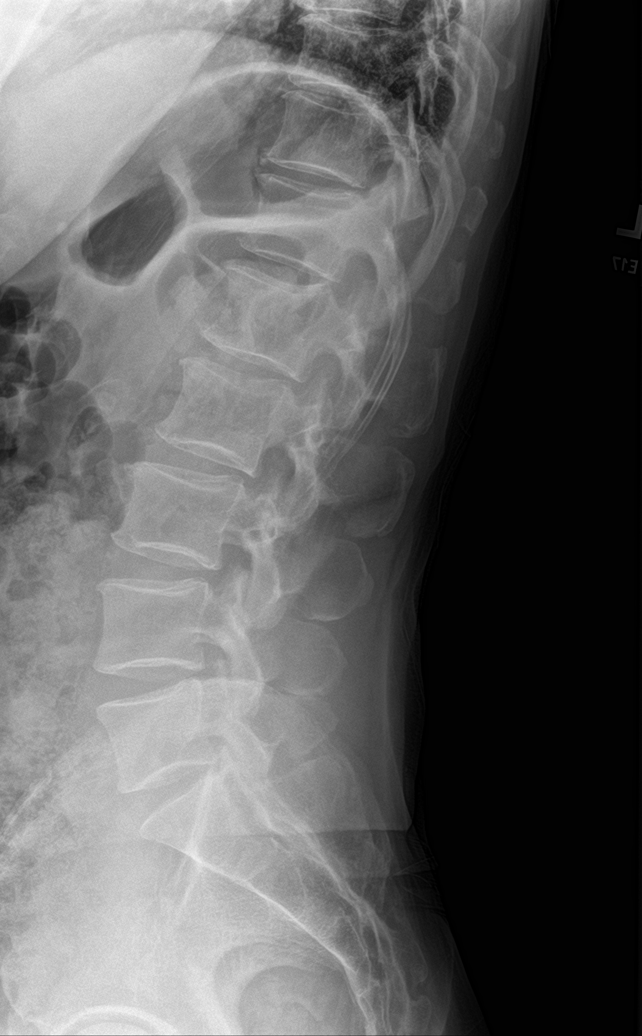
[im 5/7]
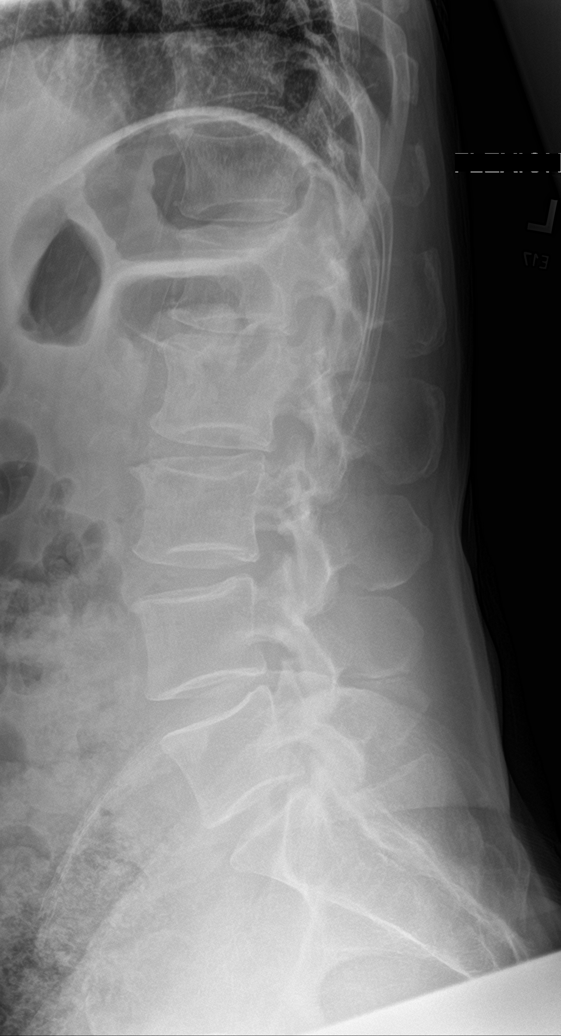
[im 6/7]
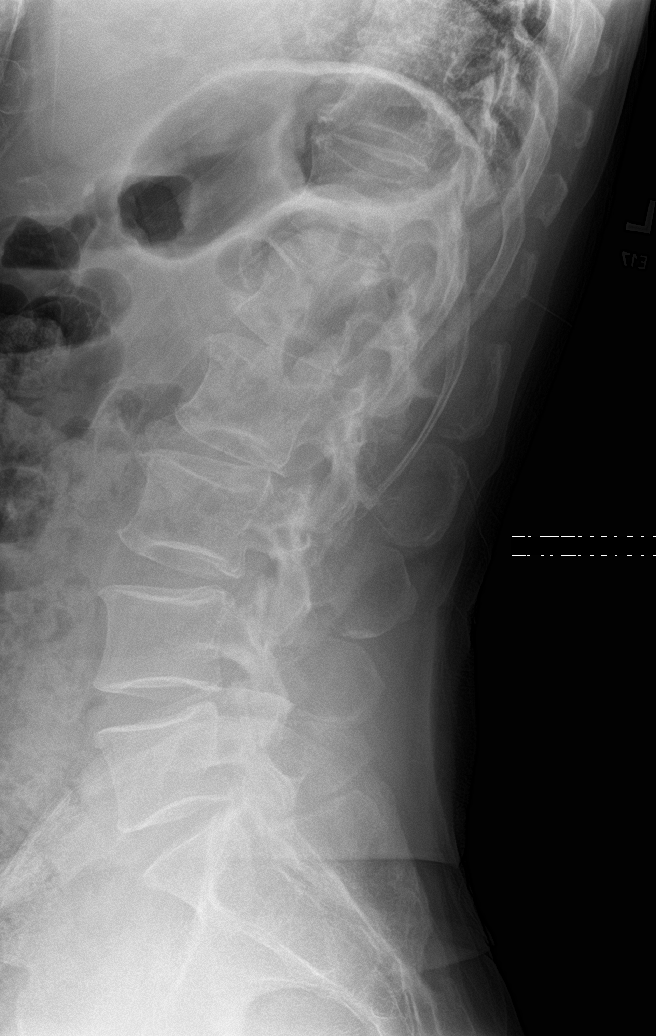
[im 7/7]
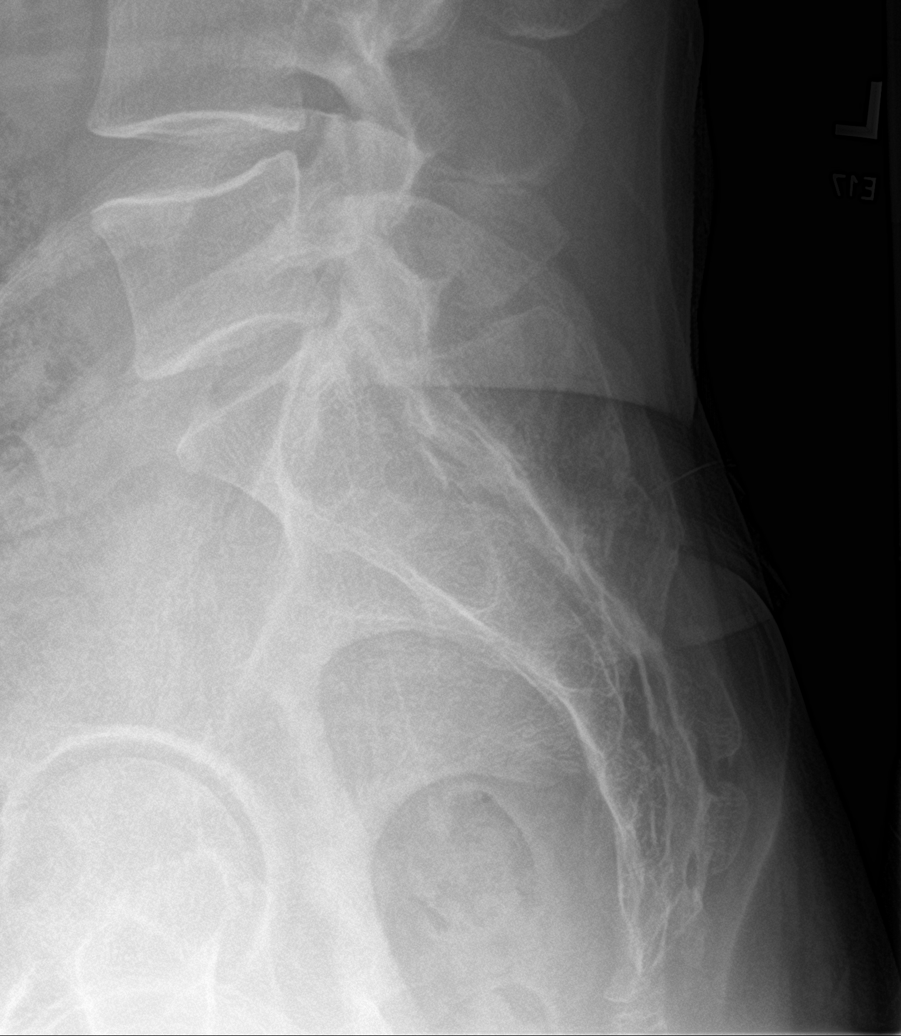

[7 of 7 positions shown; findings below may reference images not displayed]

FINDINGS: Five lumbar type vertebra. There is no acute fracture or subluxation
of the lumbar spine. Multilevel degenerative changes with disc space
narrowing and spurring. There is grade 1 L1-L2 and L2-L3
retrolisthesis. The visualized posterior elements are intact the the
soft tissues are unremarkable.
IMPRESSION: 1. No acute fracture or subluxation.
2. Multilevel degenerative changes.
